# Patient Record
Sex: Female | Born: 1945 | Race: White | Hispanic: No | Smoking: Never smoker
Health system: Southern US, Community
[De-identification: ages and names within clinical notes are randomized; demographics above are authoritative.]

## PROBLEM LIST (undated history)

## (undated) DIAGNOSIS — N189 Chronic kidney disease, unspecified: Secondary | ICD-10-CM

## (undated) DIAGNOSIS — K219 Gastro-esophageal reflux disease without esophagitis: Secondary | ICD-10-CM

## (undated) DIAGNOSIS — R55 Syncope and collapse: Secondary | ICD-10-CM

## (undated) DIAGNOSIS — H269 Unspecified cataract: Secondary | ICD-10-CM

## (undated) DIAGNOSIS — G709 Myoneural disorder, unspecified: Secondary | ICD-10-CM

## (undated) DIAGNOSIS — M1990 Unspecified osteoarthritis, unspecified site: Secondary | ICD-10-CM

## (undated) DIAGNOSIS — T7840XA Allergy, unspecified, initial encounter: Secondary | ICD-10-CM

## (undated) HISTORY — PX: BREAST REDUCTION SURGERY: SHX8

## (undated) HISTORY — DX: Chronic kidney disease, unspecified: N18.9

## (undated) HISTORY — DX: Gastro-esophageal reflux disease without esophagitis: K21.9

## (undated) HISTORY — DX: Unspecified cataract: H26.9

## (undated) HISTORY — PX: SCALP LACERATION REPAIR: SHX6089

## (undated) HISTORY — DX: Unspecified osteoarthritis, unspecified site: M19.90

## (undated) HISTORY — DX: Allergy, unspecified, initial encounter: T78.40XA

## (undated) HISTORY — DX: Myoneural disorder, unspecified: G70.9

---

## 1997-05-31 ENCOUNTER — Ambulatory Visit (HOSPITAL_COMMUNITY): Admission: RE | Admit: 1997-05-31 | Discharge: 1997-05-31 | Payer: Self-pay | Admitting: *Deleted

## 1997-11-29 ENCOUNTER — Ambulatory Visit (HOSPITAL_COMMUNITY): Admission: RE | Admit: 1997-11-29 | Discharge: 1997-11-29 | Payer: Self-pay | Admitting: *Deleted

## 1998-04-09 ENCOUNTER — Other Ambulatory Visit: Admission: RE | Admit: 1998-04-09 | Discharge: 1998-04-09 | Payer: Self-pay | Admitting: *Deleted

## 1998-04-24 ENCOUNTER — Ambulatory Visit (HOSPITAL_COMMUNITY): Admission: RE | Admit: 1998-04-24 | Discharge: 1998-04-24 | Payer: Self-pay | Admitting: *Deleted

## 1998-06-08 ENCOUNTER — Ambulatory Visit (HOSPITAL_COMMUNITY): Admission: RE | Admit: 1998-06-08 | Discharge: 1998-06-08 | Payer: Self-pay | Admitting: *Deleted

## 1999-06-11 ENCOUNTER — Ambulatory Visit (HOSPITAL_COMMUNITY): Admission: RE | Admit: 1999-06-11 | Discharge: 1999-06-11 | Payer: Self-pay | Admitting: *Deleted

## 2001-12-02 ENCOUNTER — Other Ambulatory Visit: Admission: RE | Admit: 2001-12-02 | Discharge: 2001-12-02 | Payer: Self-pay | Admitting: Internal Medicine

## 2001-12-10 ENCOUNTER — Encounter: Payer: Self-pay | Admitting: Internal Medicine

## 2001-12-10 ENCOUNTER — Ambulatory Visit (HOSPITAL_COMMUNITY): Admission: RE | Admit: 2001-12-10 | Discharge: 2001-12-10 | Payer: Self-pay | Admitting: Internal Medicine

## 2003-01-05 ENCOUNTER — Ambulatory Visit (HOSPITAL_COMMUNITY): Admission: RE | Admit: 2003-01-05 | Discharge: 2003-01-05 | Payer: Self-pay | Admitting: Internal Medicine

## 2003-01-05 ENCOUNTER — Encounter: Payer: Self-pay | Admitting: Internal Medicine

## 2003-01-16 ENCOUNTER — Ambulatory Visit (HOSPITAL_COMMUNITY): Admission: RE | Admit: 2003-01-16 | Discharge: 2003-01-16 | Payer: Self-pay | Admitting: Gastroenterology

## 2009-07-02 ENCOUNTER — Inpatient Hospital Stay (HOSPITAL_COMMUNITY): Admission: EM | Admit: 2009-07-02 | Discharge: 2009-07-06 | Payer: Self-pay | Admitting: Emergency Medicine

## 2009-07-04 ENCOUNTER — Ambulatory Visit: Payer: Self-pay | Admitting: Internal Medicine

## 2009-07-12 ENCOUNTER — Emergency Department (HOSPITAL_COMMUNITY): Admission: EM | Admit: 2009-07-12 | Discharge: 2009-07-12 | Payer: Self-pay | Admitting: Emergency Medicine

## 2009-07-17 ENCOUNTER — Encounter: Admission: RE | Admit: 2009-07-17 | Discharge: 2009-07-17 | Payer: Self-pay | Admitting: Neurological Surgery

## 2009-07-23 ENCOUNTER — Encounter: Payer: Self-pay | Admitting: Internal Medicine

## 2009-07-27 ENCOUNTER — Ambulatory Visit (HOSPITAL_COMMUNITY): Admission: RE | Admit: 2009-07-27 | Discharge: 2009-07-29 | Payer: Self-pay | Admitting: Neurological Surgery

## 2009-08-02 ENCOUNTER — Ambulatory Visit: Payer: Self-pay | Admitting: Internal Medicine

## 2009-08-02 DIAGNOSIS — R9409 Abnormal results of other function studies of central nervous system: Secondary | ICD-10-CM

## 2009-08-02 DIAGNOSIS — E785 Hyperlipidemia, unspecified: Secondary | ICD-10-CM

## 2009-08-02 DIAGNOSIS — S0990XA Unspecified injury of head, initial encounter: Secondary | ICD-10-CM | POA: Insufficient documentation

## 2009-08-02 DIAGNOSIS — I609 Nontraumatic subarachnoid hemorrhage, unspecified: Secondary | ICD-10-CM

## 2009-08-02 DIAGNOSIS — I10 Essential (primary) hypertension: Secondary | ICD-10-CM | POA: Insufficient documentation

## 2009-08-02 DIAGNOSIS — H8110 Benign paroxysmal vertigo, unspecified ear: Secondary | ICD-10-CM

## 2009-08-02 DIAGNOSIS — R413 Other amnesia: Secondary | ICD-10-CM

## 2009-08-02 DIAGNOSIS — L089 Local infection of the skin and subcutaneous tissue, unspecified: Secondary | ICD-10-CM | POA: Insufficient documentation

## 2009-08-02 DIAGNOSIS — S0100XA Unspecified open wound of scalp, initial encounter: Secondary | ICD-10-CM | POA: Insufficient documentation

## 2009-08-07 ENCOUNTER — Encounter: Payer: Self-pay | Admitting: Internal Medicine

## 2009-08-09 ENCOUNTER — Encounter: Admission: RE | Admit: 2009-08-09 | Discharge: 2009-08-09 | Payer: Self-pay | Admitting: Neurological Surgery

## 2009-08-20 ENCOUNTER — Ambulatory Visit: Payer: Self-pay | Admitting: Internal Medicine

## 2009-08-23 ENCOUNTER — Encounter: Payer: Self-pay | Admitting: Internal Medicine

## 2009-08-27 ENCOUNTER — Encounter: Admission: RE | Admit: 2009-08-27 | Discharge: 2009-09-26 | Payer: Self-pay | Admitting: Neurology

## 2010-05-19 ENCOUNTER — Encounter: Payer: Self-pay | Admitting: Neurological Surgery

## 2010-05-28 NOTE — Assessment & Plan Note (Signed)
Summary: 2wk f/u/vs   CC:  follow-up visit.  History of Present Illness: Candace Robbins is in for her routine visit. She is currently in her 18th day of IV Ancef therapy and completing a 26 days of total antibiotics for her methicillin sensitive staph aureus scalp wound infection.  She says that problem is doing much better.  She had a recent brain MRI scan on April 14 which showed that the "T2 signal abnormality had significantly regressed". She is due to start vestibular therapy soon for her chronic dizziness/vertigo.  She and her daughter are concerned about what caused her fall and have the closed head injury last month.  They are also concerned about her more chronic deterioration of her handwriting in memory.  Preventive Screening-Counseling & Management  Alcohol-Tobacco     Alcohol drinks/day: 0     Smoking Status: never  Caffeine-Diet-Exercise     Caffeine use/day: yes     Does Patient Exercise: no  Safety-Violence-Falls     Seat Belt Use: yes  Vital Signs:  Patient profile:   65 year old female Menstrual status:  postmenopausal Height:      68 inches (172.72 cm) Weight:      205.5 pounds (93.41 kg) BMI:     31.36 Temp:     98.0 degrees F (36.67 degrees C) oral Pulse rate:   82 / minute BP sitting:   139 / 86  (right arm) Cuff size:   large  Vitals Entered By: Jennet Maduro RN (August 20, 2009 3:32 PM) CC: follow-up visit Is Patient Diabetic? No Pain Assessment Patient in pain? no      Nutritional Status BMI of > 30 = obese Nutritional Status Detail appetite :very good"  Have you ever been in a relationship where you felt threatened, hurt or afraid?not asked, neice present   Does patient need assistance? Functional Status Cook/clean, Shopping, Social activities Ambulation Normal   Physical Exam  General:  alert and overweight-appearing.   Head:  her left posterior scalp incision is healing nicely.  There is no further drainage or open lesions.  I see no  evidence of active infection. Eyes:  vision grossly intact, pupils equal, and pupils round.   Neurologic:  alert & oriented X3, cranial nerves II-XII intact, strength normal in all extremities, and gait normal.  her speech is normal.   Impression & Recommendations:  Problem # 1:  WOUND INFECTION (ICD-686.9) Her scalp wound infection has healed. I will stop her Ancef today and have the PICC pulled. Orders: Est. Patient Level IV (16109)  Problem # 2:  BENIGN PAROXYSMAL POSITIONAL VERTIGO (ICD-386.11) This problem sounds to be chronic and unchanged in frequency but quite bothersome for her.  I doubt that it was the cause of her fall leading to a closed head injury recently.  While hospitalized there is no evidence of seizure activity, cardiac events, orthostatic hypotension, CNS infection or stroke causing her fall.  I told her and her daughter that this will need to be monitored going forward.  I am hopeful that the vestibular therapy will help. Orders: Est. Patient Level IV (60454)  Problem # 3:  MRI, BRAIN, ABNORMAL (ICD-794.09) It appears that the abnormalities on her MRI were related to her closed head injury and are improving spontaneously with time.  I see no evidence of CNS infection and do not feel she needs any further evaluation for this.  At present there is no clear explanation for her intermittent memory problems or history of deteriorating  handwriting.  I suggested that she have Dr. Leonides Sake, her primary care physician, and Dr. Kelli Hope, her neurologist, monitor her these problems and help determine what might need to be done for them. Orders: Est. Patient Level IV (47829)  Medications Added to Medication List This Visit: 1)  Lisinopril-hydrochlorothiazide 20-25 Mg Tabs (Lisinopril-hydrochlorothiazide) .... Take 1 tablet by mouth once a day 2)  Indomethacin 25 Mg Caps (Indomethacin) .... Take 1 tablet by mouth three times a day as needed for gout  Patient  Instructions: 1)  Please schedule a follow-up appointment as needed.

## 2010-05-28 NOTE — Assessment & Plan Note (Signed)
Summary: HSFU/KAM   CC:  [facility].  History of Present Illness: Ms. Candace Robbins is in for her hospital follow-up visit.  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  She also had a left posterior scalp laceration.  An MRI scan of her brain showed diffuse white matter changes which were not felt to be related to the trauma and I was consulted.  She had no evidence of infection in and we decided to observe her and not pursue a lumbar puncture at that time.  She and her niece were able to describe that she had been having some deterioration in her hand writing over the last 6 to 12 months and also intermittent periods of forgetfulness.  All of this predated the fall and subarachnoid hemorrhages.  Since discharge from the hospital her niece has said that she has been having her right almost daily and says that she has noted further deterioration in her handwriting.  She is also noted that she would sometimes used incorrect words or invert words which she says is very much unlike her particularly since she is a typesetter and normally would have no difficulties with this.  Also, she recently did not know who her grandniece is best friend was when she came to visit only recalling a day later that her name was Angie.   She was rehospitalized briefly recently when her scalp incision became infected.  She underwent incision and drainage and operative cultures grew methicillin sensitive staph aureus.  She had a PICC placed and has been receiving vancomycin at home.  Preventive Screening-Counseling & Management  Alcohol-Tobacco     Alcohol drinks/day: 0     Smoking Status: never  Caffeine-Diet-Exercise     Caffeine use/day: yes     Does Patient Exercise: no  Safety-Violence-Falls     Seat Belt Use: yes   Updated Prior Medication List: VANCOMYCIN HCL 1000 MG SOLR (VANCOMYCIN HCL) iv   Past History:  Past Medical  History: Hyperlipidemia Hypertension  Additional History Menstrual Status:  postmenopausal  Vital Signs:  Patient profile:   65 year old female Menstrual status:  postmenopausal Height:      68 inches (172.72 cm) Weight:      206.5 pounds (93.86 kg) BMI:     31.51 Pulse rate:   70 / minute BP sitting:   185 / 108  (right arm) Cuff size:   regular  Vitals Entered By: Jennet Maduro RN (August 02, 2009 3:29 PM) CC: HSFU Pain Assessment Patient in pain? no      Nutritional Status BMI of > 30 = obese Nutritional Status Detail appetite "good"  Have you ever been in a relationship where you felt threatened, hurt or afraid?not asked someone else present   Does patient need assistance? Functional Status Self care Ambulation Impaired:Risk for fall Comments uses four-prong cane     Menstrual Status postmenopausal   Physical Exam  General:  alert and overweight-appearing.   Head:  her left posterior scalp incision is stapled and intact.  There is swelling of the wound but her niece says this has improved.  There is no drainage at this time. Eyes:  vision grossly intact, pupils equal, and pupils round.   Lungs:  normal breath sounds.   Heart:  normal rate, regular rhythm, and no murmur.   Neurologic:  alert & oriented X3, cranial nerves II-XII intact, strength normal in all extremities, and gait  normal.  her speech is normal. Psych:  normally interactive, good eye contact, not anxious appearing, and not depressed appearing.     Impression & Recommendations:  Problem # 1:  WOUND INFECTION (ICD-686.9) I will change her to IV Ancef which is probably a little more effective for a methicillin sensitive staph aureus the vancomycin.  Problem # 2:  MRI, BRAIN, ABNORMAL (ICD-794.09) I do not know the cause of her white matter lesions on her MRI scan but I do suspect that she does have some chronic slowly progressive neurologic illness that is causing her memory loss and her  deterioration in her handwriting.  I am not convinced that this is infectious in nature.  She is scheduled for a follow-up MRI in one week once the staples have been removed from her incision. Orders: Est. Patient Level IV (04540)  Medications Added to Medication List This Visit: 1)  Cefazolin Sodium 1 Gm Solr (Cefazolin sodium) .... Via pic every 8 hours  Patient Instructions: 1)  Please schedule a follow-up appointment in 2 weeks.

## 2010-05-28 NOTE — Miscellaneous (Signed)
Summary: HIPAA Restrictions  HIPAA Restrictions   Imported By: Florinda Marker 08/03/2009 15:07:50  _____________________________________________________________________  External Attachment:    Type:   Image     Comment:   External Document

## 2010-05-28 NOTE — Letter (Signed)
Summary: Health Care Power Of [facility] Power Of Attorney   Imported By: Florinda Marker 08/08/2009 09:42:52  _____________________________________________________________________  External Attachment:    Type:   Image     Comment:   External Document

## 2010-05-28 NOTE — Consult Note (Signed)
Summary: [facility]   Imported By: Florinda Marker 08/09/2009 15:41:37  _____________________________________________________________________  External Attachment:    Type:   Image     Comment:   External Document

## 2010-05-31 NOTE — Miscellaneous (Signed)
Summary: [facility]: Orders  [facility]: Orders   Imported By: Florinda Marker 09/13/2009 11:39:50  _____________________________________________________________________  External Attachment:    Type:   Image     Comment:   External Document

## 2010-07-17 LAB — GRAM STAIN

## 2010-07-17 LAB — ANAEROBIC CULTURE

## 2010-07-17 LAB — WOUND CULTURE

## 2010-07-17 LAB — VANCOMYCIN, TROUGH: Vancomycin Tr: 19.4 ug/mL (ref 10.0–20.0)

## 2010-07-22 LAB — APTT: aPTT: 26 seconds (ref 24–37)

## 2010-07-22 LAB — BASIC METABOLIC PANEL
BUN: 19 mg/dL (ref 6–23)
BUN: 22 mg/dL (ref 6–23)
BUN: 29 mg/dL — ABNORMAL HIGH (ref 6–23)
Calcium: 8.5 mg/dL (ref 8.4–10.5)
Calcium: 9.1 mg/dL (ref 8.4–10.5)
Calcium: 9.7 mg/dL (ref 8.4–10.5)
Chloride: 104 mEq/L (ref 96–112)
Chloride: 109 mEq/L (ref 96–112)
Chloride: 109 mEq/L (ref 96–112)
Chloride: 111 mEq/L (ref 96–112)
Creatinine, Ser: 0.79 mg/dL (ref 0.4–1.2)
Creatinine, Ser: 1.06 mg/dL (ref 0.4–1.2)
GFR calc Af Amer: >60 mL/min (ref >60)
GFR calc non Af Amer: 52 mL/min — ABNORMAL LOW (ref >60)
GFR calc non Af Amer: 56 mL/min — ABNORMAL LOW (ref >60)
GFR calc non Af Amer: >60 mL/min (ref >60)
GFR calc non Af Amer: >60 mL/min (ref >60)
Glucose, Bld: 138 mg/dL — ABNORMAL HIGH (ref 70–99)
Glucose, Bld: 145 mg/dL — ABNORMAL HIGH (ref 70–99)
Potassium: 3.5 mEq/L (ref 3.5–5.1)
Potassium: 4.1 mEq/L (ref 3.5–5.1)
Potassium: 4.4 mEq/L (ref 3.5–5.1)
Potassium: 4.4 mEq/L (ref 3.5–5.1)
Potassium: 4.4 mEq/L (ref 3.5–5.1)
Sodium: 137 mEq/L (ref 135–145)
Sodium: 140 mEq/L (ref 135–145)
Sodium: 142 mEq/L (ref 135–145)

## 2010-07-22 LAB — CBC
HCT: 36.4 % (ref 36.0–46.0)
HCT: 36.7 % (ref 36.0–46.0)
HCT: 37.7 % (ref 36.0–46.0)
HCT: 38.9 % (ref 36.0–46.0)
HCT: 43.4 % (ref 36.0–46.0)
Hemoglobin: 12.7 g/dL (ref 12.0–15.0)
Hemoglobin: 12.7 g/dL (ref 12.0–15.0)
Hemoglobin: 13.5 g/dL (ref 12.0–15.0)
Hemoglobin: 15.1 g/dL — ABNORMAL HIGH (ref 12.0–15.0)
MCV: 93.9 fL (ref 78.0–100.0)
MCV: 93.9 fL (ref 78.0–100.0)
MCV: 94.2 fL (ref 78.0–100.0)
MCV: 94.8 fL (ref 78.0–100.0)
Platelets: 127 10*3/uL — ABNORMAL LOW (ref 150–400)
Platelets: 140 10*3/uL — ABNORMAL LOW (ref 150–400)
Platelets: 172 10*3/uL (ref 150–400)
RBC: 3.98 MIL/uL (ref 3.87–5.11)
RBC: 4.14 MIL/uL (ref 3.87–5.11)
RBC: 4.62 MIL/uL (ref 3.87–5.11)
RDW: 12.9 % (ref 11.5–15.5)
RDW: 13.3 % (ref 11.5–15.5)
RDW: 13.4 % (ref 11.5–15.5)
WBC: 16.8 10*3/uL — ABNORMAL HIGH (ref 4.0–10.5)
WBC: 6.4 10*3/uL (ref 4.0–10.5)
WBC: 7.3 10*3/uL (ref 4.0–10.5)

## 2010-07-22 LAB — DIFFERENTIAL
Eosinophils Absolute: 0.1 10*3/uL (ref 0.0–0.7)
Eosinophils Relative: 2 % (ref 0–5)
Lymphocytes Relative: 26 % (ref 12–46)
Lymphocytes Relative: 31 % (ref 12–46)
Lymphs Abs: 2 10*3/uL (ref 0.7–4.0)
Neutro Abs: 4.8 10*3/uL (ref 1.7–7.7)
Neutrophils Relative %: 58 % (ref 43–77)
Neutrophils Relative %: 66 % (ref 43–77)

## 2010-07-22 LAB — ANA: Anti Nuclear Antibody(ANA): NEGATIVE

## 2010-07-22 LAB — URINALYSIS, ROUTINE W REFLEX MICROSCOPIC
Leukocytes, UA: NEGATIVE
Protein, ur: 100 mg/dL — AB
pH: 8 (ref 5.0–8.0)

## 2010-07-22 LAB — URINE MICROSCOPIC-ADD ON

## 2010-07-22 LAB — RPR: RPR Ser Ql: NONREACTIVE

## 2010-07-22 LAB — SURGICAL PCR SCREEN
MRSA, PCR: NEGATIVE
Staphylococcus aureus: POSITIVE — AB

## 2010-07-22 LAB — PROTIME-INR
INR: 0.99 (ref 0.00–1.49)
Prothrombin Time: 13 seconds (ref 11.6–15.2)

## 2010-07-22 LAB — C-REACTIVE PROTEIN: CRP: 1.2 mg/dL — ABNORMAL HIGH (ref <0.6)

## 2010-07-22 LAB — SEDIMENTATION RATE: Sed Rate: 21 mm/hr (ref 0–22)

## 2010-08-06 ENCOUNTER — Encounter: Payer: Self-pay | Admitting: *Deleted

## 2010-09-13 NOTE — Op Note (Signed)
   NAME:  Candace Robbins, Candace Robbins                         ACCOUNT NO.:  [id number]   MEDICAL RECORD NO.:  [id number]                   PATIENT TYPE:  AMB   LOCATION:  ENDO                                 FACILITY:  [facility]   PHYSICIAN:  John C. Madilyn Fireman, M.D.                 DATE OF BIRTH:  1946/03/04   DATE OF PROCEDURE:  01/16/2003  DATE OF DISCHARGE:                                 OPERATIVE REPORT   PROCEDURE:  Colonoscopy.   INDICATIONS FOR PROCEDURE:  Family history of colon cancer in two first  degree relative.   DESCRIPTION OF PROCEDURE:  The patient was placed in the left lateral  decubitus position then placed on the pulse monitor with continuous low flow  oxygen delivered by nasal cannula. She was sedated with 100 mcg IV fentanyl  and 10 mg IV Versed. The Olympus video colonoscope was inserted into the  rectum and advanced to the cecum, confirmed by transillumination at  McBurney's point and visualization at the ileocecal valve and appendiceal  orifice. The prep was excellent. The cecum, ascending, transverse,  descending and sigmoid colon all appeared normal with no masses, polyps,  diverticula or other mucosal abnormalities. The rectum likewise appeared  normal and retroflexed view of the anus revealed no obvious internal  hemorrhoids. The colonoscope was then withdrawn and the patient returned to  the recovery room in stable condition. The patient tolerated the procedure  well and there were no immediate complications.   IMPRESSION:  Normal study.   PLAN:  Repeat colonoscopy in five years.                                               John C. Madilyn Fireman, M.D.    [facility]/MEDQ  D:  01/16/2003  T:  01/16/2003  Job:  [id number]   cc:   Lovenia Kim, D.O.  [address], [address]  [place]  [place] [address]  Fax: [phone]

## 2010-11-05 IMAGING — CT CT HEAD W/O CM
2 series · 16 of 30 positions shown, 18 images · non-contrast
Comparison: 07/02/2009

CLINICAL DATA: Follow-up subarachnoid hemorrhage

CT HEAD WITHOUT CONTRAST
TECHNIQUE: Contiguous axial images were obtained from the base of
the skull through the vertex without contrast.

[Series 3: head bone · axial · 0.49mm/px · z∈[+21,+151]mm · 8 of 64 slices shown]
[im 7/64  bone]
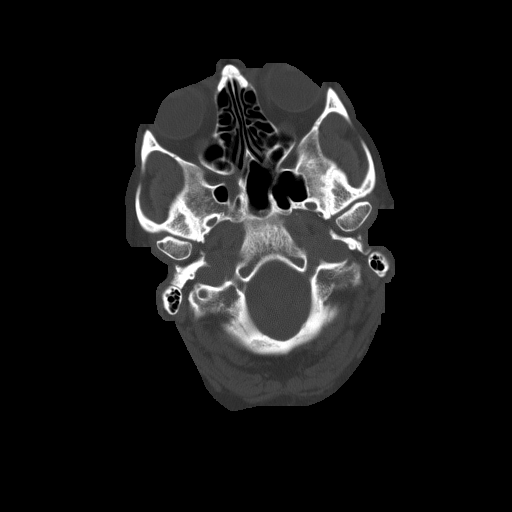
[im 14/64  bone]
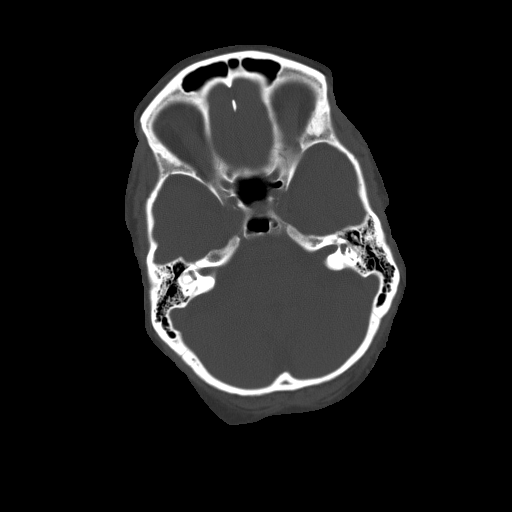
[im 20/64  bone]
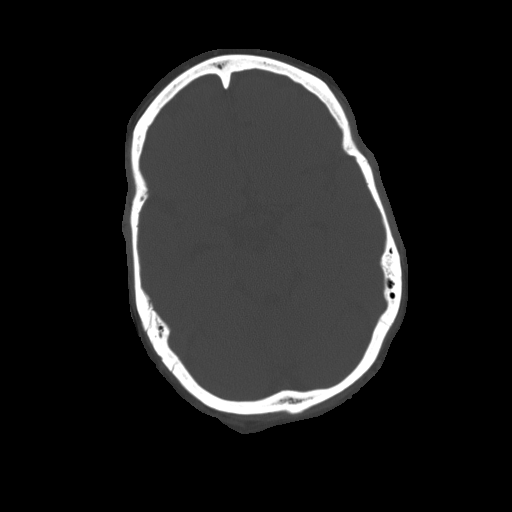
[im 27/64  bone]
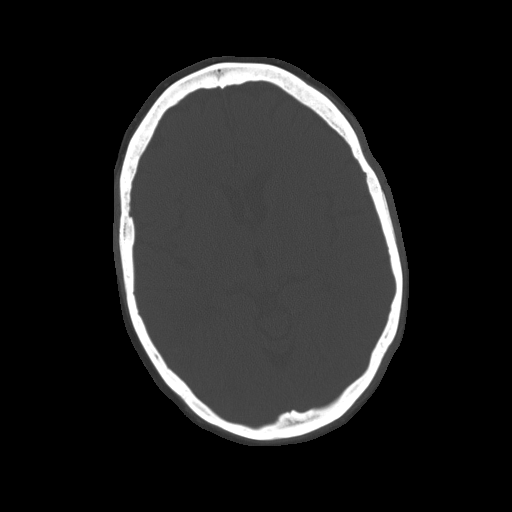
[im 37/64  bone]
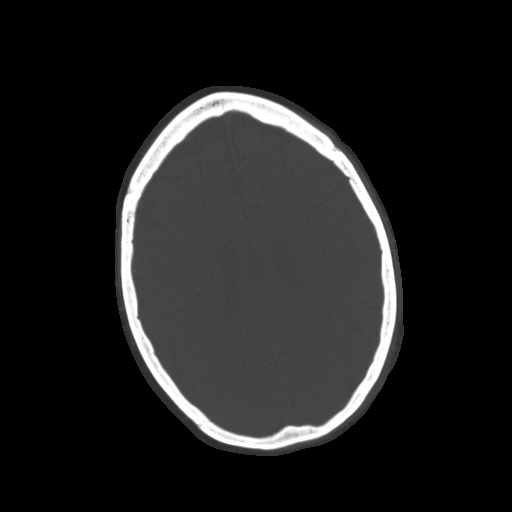
[im 44/64  bone]
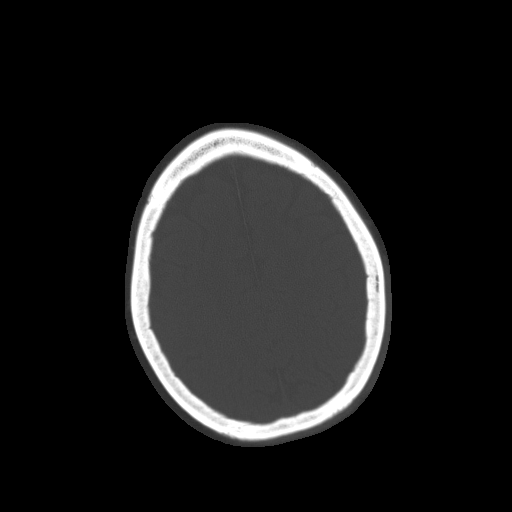
[im 50/64  bone]
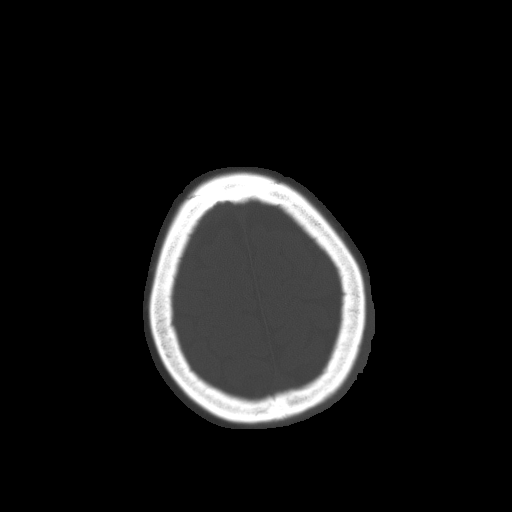
[im 57/64  bone]
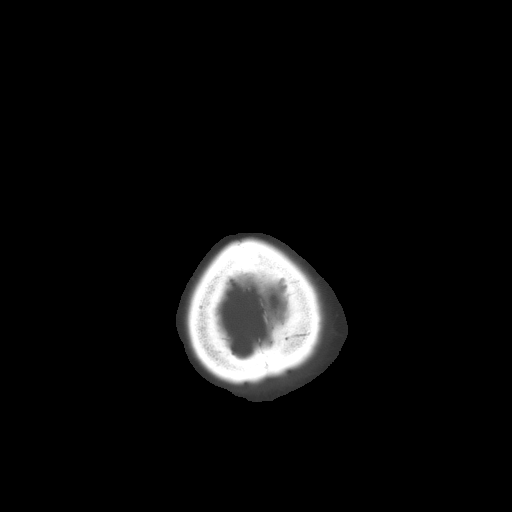

[Series 32: 3d filtered head w/o · axial · non-contrast · 0.49mm/px · z∈[+23,+147]mm · 8 of 32 slices shown, 10 images]
[im 4/32  brain]
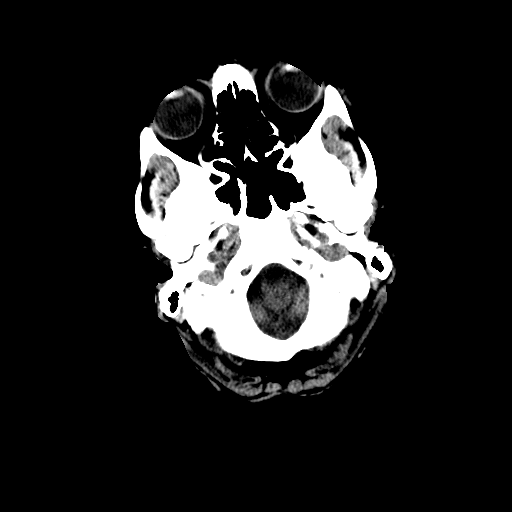
[im 4/32  bone]
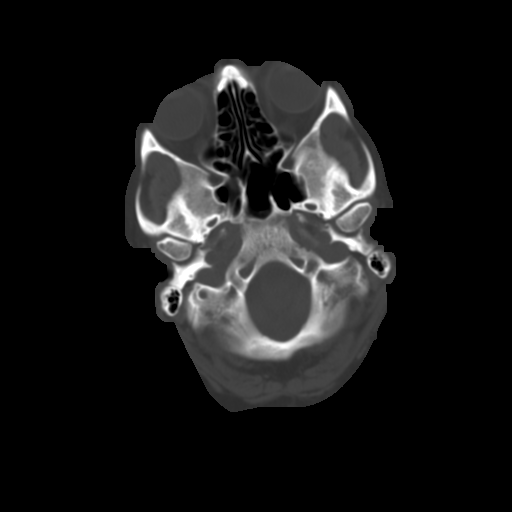
[im 7/32  brain]
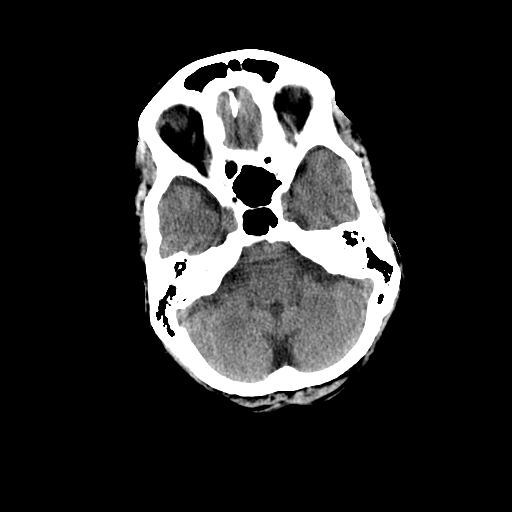
[im 11/32  brain]
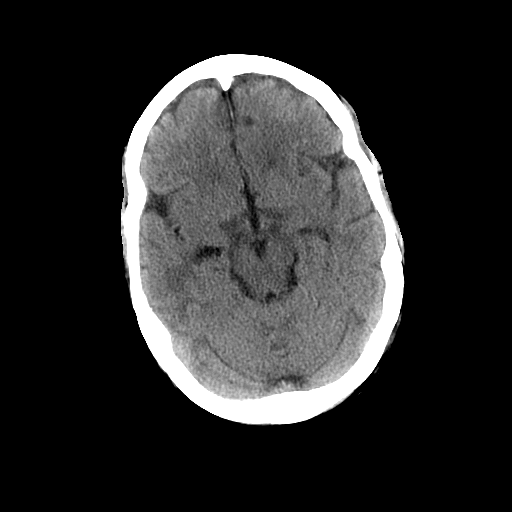
[im 14/32  brain]
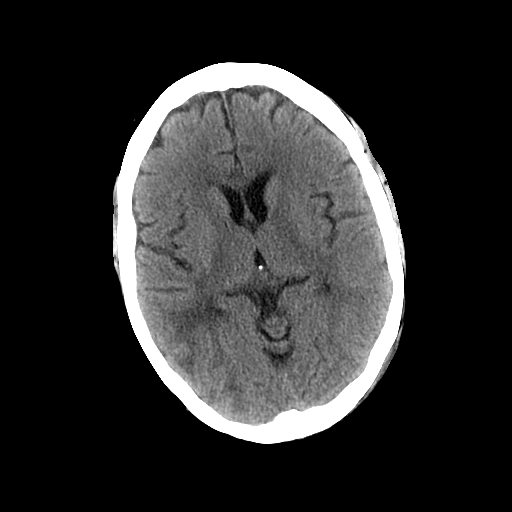
[im 18/32  brain]
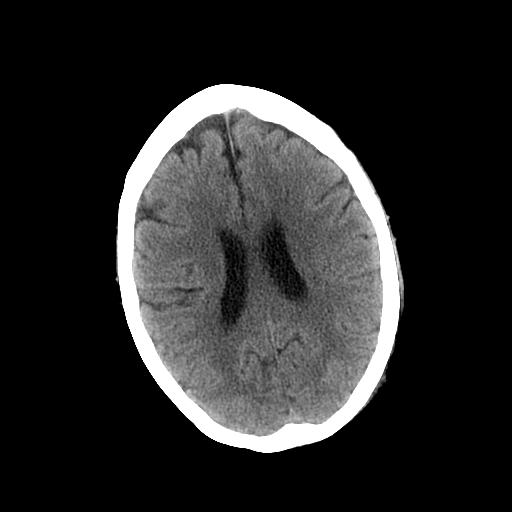
[im 18/32  bone]
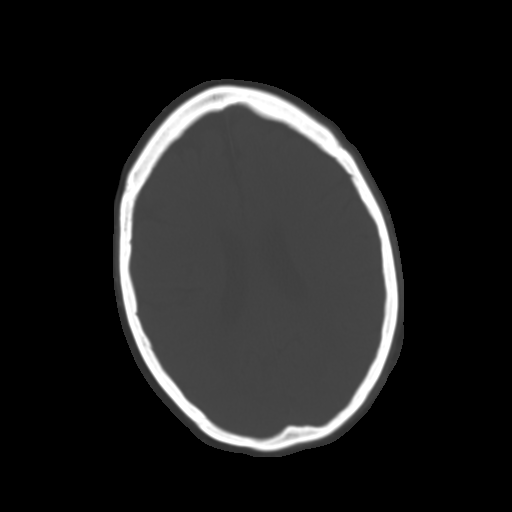
[im 21/32  brain]
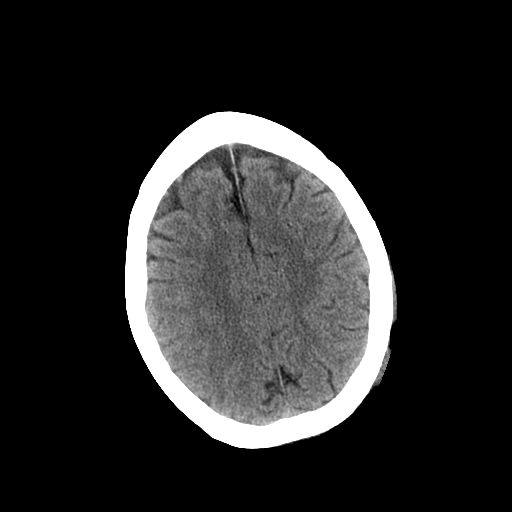
[im 25/32  brain]
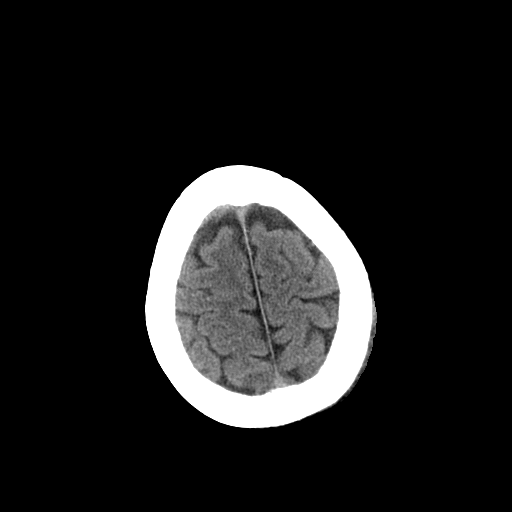
[im 28/32  brain]
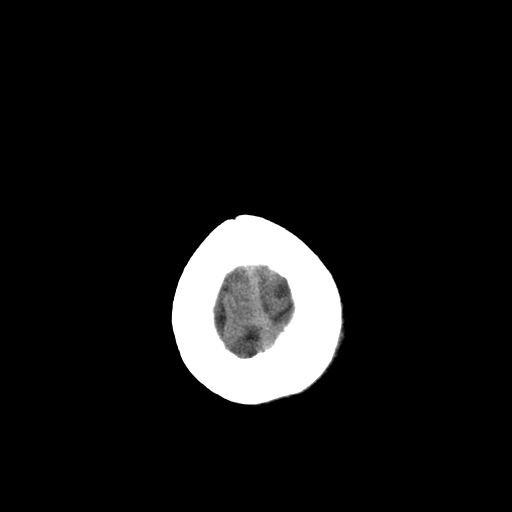

[16 of 30 positions shown; findings below may reference images not displayed]

FINDINGS: There is no residual hyperdense blood within the
intracranial region.  Low density in the white matter of the right
temporo-parietal region and to a lesser extent in both occipital
regions remains evident.  No sign of acute infarction, mass lesion,
acute hemorrhage, hydrocephalus or extra-axial collection.  Scalp
injury on the left remains evident.  No underlying skull fracture.
Sinuses, middle ears and mastoids are clear.
IMPRESSION: Resolution of intracranial hemorrhage. Persistent low density in
the white matter of the right temporo-parietal region.  Lesser low
density in the occipital white matter.  Etiology of this remains
uncertain.  Findings could be old and inactive or could possibly
relate to an active process including gliomatosis, post-traumatic
change, sequela of autoregulatory disorder and chronic ischemic
change.  Follow-up MRI for comparison with the 07/03/2009 study
would be useful.

## 2011-05-05 ENCOUNTER — Ambulatory Visit (INDEPENDENT_AMBULATORY_CARE_PROVIDER_SITE_OTHER): Payer: Medicare Other

## 2011-05-05 DIAGNOSIS — M1710 Unilateral primary osteoarthritis, unspecified knee: Secondary | ICD-10-CM

## 2013-02-20 ENCOUNTER — Ambulatory Visit (INDEPENDENT_AMBULATORY_CARE_PROVIDER_SITE_OTHER): Payer: Medicare Other | Admitting: Emergency Medicine

## 2013-02-20 VITALS — BP 100/60 | HR 74 | Temp 98.1°F | Resp 16 | Ht 67.0 in | Wt 192.0 lb

## 2013-02-20 DIAGNOSIS — L0291 Cutaneous abscess, unspecified: Secondary | ICD-10-CM

## 2013-02-20 DIAGNOSIS — L0390 Cellulitis, unspecified: Secondary | ICD-10-CM

## 2013-02-20 MED ORDER — CIPROFLOXACIN HCL 500 MG PO TABS: 500.0000 mg | ORAL_TABLET | Freq: Two times a day (BID) | ORAL | Status: DC

## 2013-02-20 MED ORDER — DOXYCYCLINE HYCLATE 100 MG PO CAPS: 100.0000 mg | ORAL_CAPSULE | Freq: Two times a day (BID) | ORAL | Status: DC

## 2013-02-20 NOTE — Progress Notes (Signed)
[facility] [address] [place] [address] [phone]  Date:  02/20/2013   Name:  Candace Robbins   DOB:  03/13/1946   MRN:  [id number]  PCP:  No primary provider on file.    Chief Complaint: Toe Pain   History of Present Illness:  Candace Robbins is a 68 y.o. very pleasant female patient who presents with the following:  Developed pain in her right second toe about a week ago.  No history of injury.  Has history of gout.  No fever or chills.  No nausea or vomiting.  No rash.  No improvement with over the counter medications or other home remedies. Denies other complaint or health concern today.   Patient Active Problem List   Diagnosis Date Noted  . HYPERLIPIDEMIA 08/02/2009  . BENIGN PAROXYSMAL POSITIONAL VERTIGO 08/02/2009  . HYPERTENSION 08/02/2009  . SUBARACHNOID HEMORRHAGE 08/02/2009  . WOUND INFECTION 08/02/2009  . MEMORY LOSS 08/02/2009  . MRI, BRAIN, ABNORMAL 08/02/2009  . LACERATION, SCALP 08/02/2009  . HEAD TRAUMA, CLOSED 08/02/2009    Past Medical History  Diagnosis Date  . Allergy   . Arthritis   . GERD (gastroesophageal reflux disease)   . Chronic kidney disease   . Neuromuscular disorder   . Cataract     Past Surgical History  Procedure Laterality Date  . Breast surgery      History  Substance Use Topics  . Smoking status: Never Smoker   . Smokeless tobacco: Not on file  . Alcohol Use: Not on file    Family History  Problem Relation Age of Onset  . Cancer Mother     No Known Allergies  Medication list has been reviewed and updated.  Current Outpatient Prescriptions on File Prior to Visit  Medication Sig Dispense Refill  . lisinopril-hydrochlorothiazide (PRINZIDE,ZESTORETIC) 20-25 MG per tablet Take 1 tablet by mouth daily.        . indomethacin (INDOCIN) 25 MG capsule Take 25 mg by mouth 3 (three) times daily with meals.         No current facility-administered medications on file prior to visit.     Review of Systems:  As per HPI, otherwise negative.    Physical Examination: Filed Vitals:   02/20/13 1402  BP: 100/60  Pulse: 74  Temp: 98.1 F (36.7 C)  Resp: 16   Filed Vitals:   02/20/13 1402  Height: 5\' 7"  (1.702 m)  Weight: 192 lb (87.091 kg)   Body mass index is 30.06 kg/(m^2). Ideal Body Weight: Weight in (lb) to have BMI = 25: 159.3   GEN: WDWN, NAD, Non-toxic, Alert & Oriented x 3 HEENT: Atraumatic, Normocephalic.  Ears and Nose: No external deformity. EXTR: No clubbing/cyanosis/edema NEURO: Normal gait.  PSYCH: Normally interactive. Conversant. Not depressed or anxious appearing.  Calm demeanor.  RIGHT foot:  Second toe erythematous, warm and swollen.  Very tender laterally at DIP.  No ecchymosis or deformity.    Assessment and Plan: Cellulitis foot cipro Doxy Warm soaks Follow up as needed   Signed,  Phillips Odor, MD

## 2013-02-20 NOTE — Patient Instructions (Signed)
Cellulitis Cellulitis is an infection of the skin and the tissue beneath it. The infected area is usually red and tender. Cellulitis occurs most often in the arms and lower legs.  CAUSES  Cellulitis is caused by bacteria that enter the skin through cracks or cuts in the skin. The most common types of bacteria that cause cellulitis are Staphylococcus and Streptococcus. SYMPTOMS   Redness and warmth.  Swelling.  Tenderness or pain.  Fever. DIAGNOSIS  Your caregiver can usually determine what is wrong based on a physical exam. Blood tests may also be done. TREATMENT  Treatment usually involves taking an antibiotic medicine. HOME CARE INSTRUCTIONS   Take your antibiotics as directed. Finish them even if you start to feel better.  Keep the infected arm or leg elevated to reduce swelling.  Apply a warm cloth to the affected area up to 4 times per day to relieve pain.  Only take over-the-counter or prescription medicines for pain, discomfort, or fever as directed by your caregiver.  Keep all follow-up appointments as directed by your caregiver. SEEK MEDICAL CARE IF:   You notice red streaks coming from the infected area.  Your red area gets larger or turns dark in color.  Your bone or joint underneath the infected area becomes painful after the skin has healed.  Your infection returns in the same area or another area.  You notice a swollen bump in the infected area.  You develop new symptoms. SEEK IMMEDIATE MEDICAL CARE IF:   You have a fever.  You feel very sleepy.  You develop vomiting or diarrhea.  You have a general ill feeling (malaise) with muscle aches and pains. MAKE SURE YOU:   Understand these instructions.  Will watch your condition.  Will get help right away if you are not doing well or get worse. Document Released: 01/22/2005 Document Revised: 10/14/2011 Document Reviewed: 06/30/2011 Exit[employer] Patient Information 2014 [employer].  

## 2013-03-03 ENCOUNTER — Other Ambulatory Visit: Payer: Self-pay | Admitting: Family Medicine

## 2013-03-03 ENCOUNTER — Ambulatory Visit (INDEPENDENT_AMBULATORY_CARE_PROVIDER_SITE_OTHER): Payer: Medicare Other | Admitting: Family Medicine

## 2013-03-03 ENCOUNTER — Ambulatory Visit: Payer: Medicare Other

## 2013-03-03 VITALS — BP 142/62 | HR 110 | Temp 97.7°F | Resp 20 | Ht 67.0 in | Wt 193.0 lb

## 2013-03-03 DIAGNOSIS — M79674 Pain in right toe(s): Secondary | ICD-10-CM

## 2013-03-03 DIAGNOSIS — J3489 Other specified disorders of nose and nasal sinuses: Secondary | ICD-10-CM

## 2013-03-03 DIAGNOSIS — M7989 Other specified soft tissue disorders: Secondary | ICD-10-CM

## 2013-03-03 DIAGNOSIS — M79609 Pain in unspecified limb: Secondary | ICD-10-CM

## 2013-03-03 LAB — POCT CBC
Granulocyte percent: 61.9 %G (ref 37–80)
HCT, POC: 39.3 % (ref 37.7–47.9)
Hemoglobin: 12.5 g/dL (ref 12.2–16.2)
Lymph, poc: 2.7 (ref 0.6–3.4)
MCH, POC: 31.5 pg — AB (ref 27–31.2)
MCHC: 31.8 g/dL (ref 31.8–35.4)
MCV: 99 fL — AB (ref 80–97)
MID (cbc): 0.6 (ref 0–0.9)
MPV: 8.8 fL (ref 0–99.8)
POC Granulocyte: 5.3 (ref 2–6.9)
POC LYMPH PERCENT: 31.7 % (ref 10–50)
POC MID %: 6.4 % (ref 0–12)
Platelet Count, POC: 190 10*3/uL (ref 142–424)
RBC: 3.97 M/uL — AB (ref 4.04–5.48)
RDW, POC: 13.4 %
WBC: 8.6 10*3/uL (ref 4.6–10.2)

## 2013-03-03 NOTE — Progress Notes (Signed)
[facility]:  Office Visit  Chief Complaint:  Chief Complaint  Patient presents with  . Nail Problem    HPI: Candace Robbins is a 67 y.o. female who is here for right  2nd toe pain. She was in office 02/27/2013 for a bacterial infection in her right 2nd toe. She was given Doxycycline and Cipro for the toe infection x 10 days. She has run out of the antibiotics and her toe is not getting better. She has redness and swelling and pain. On the pain scale she states the pain is 5/10, before it was 9/10, she describes it as a dull aching pain, has pain with weight bearing. She has been soaking her foot in Epson [employer] every night.  She has not been elevating She states her pain is better yesterday but worse today She has been off of abx medicine x 1 day No sxs of fever.chills ( She states she does not have fevers generally), no known injury, no e.o diabetes  Left nasal lesion x 5-6 years, does bleed and ulcerates. She was referred to go to derm to see if it is cancerous but has not since not able to afford it, she reently got medicare several years ago.   Past Medical History  Diagnosis Date  . Allergy   . Arthritis   . GERD (gastroesophageal reflux disease)   . Chronic kidney disease   . Neuromuscular disorder   . Cataract    Past Surgical History  Procedure Laterality Date  . Breast surgery     History   Social History  . Marital Status: Married    Spouse Name: N/A    Number of Children: N/A  . Years of Education: N/A   Social History Main Topics  . Smoking status: Never Smoker   . Smokeless tobacco: None  . Alcohol Use: None  . Drug Use: None  . Sexual Activity: None   Other Topics Concern  . None   Social History Narrative  . None   Family History  Problem Relation Age of Onset  . Cancer Mother    No Known Allergies Prior to Admission medications   Medication Sig Start Date End Date Taking? Authorizing Provider  aspirin 81 MG tablet Take 81  mg by mouth daily.   Yes Historical Provider, MD  indomethacin (INDOCIN) 25 MG capsule Take 25 mg by mouth 3 (three) times daily with meals.     Yes Historical Provider, MD  lisinopril-hydrochlorothiazide (PRINZIDE,ZESTORETIC) 20-25 MG per tablet Take 1 tablet by mouth daily.     Yes Historical Provider, MD  simvastatin (ZOCOR) 20 MG tablet Take 20 mg by mouth every evening.   Yes Historical Provider, MD  traMADol (ULTRAM) 50 MG tablet Take 50 mg by mouth every 6 (six) hours as needed for pain.   Yes Historical Provider, MD  ciprofloxacin (CIPRO) 500 MG tablet Take 1 tablet (500 mg total) by mouth 2 (two) times daily. 02/20/13   Phillips Odor, MD  doxycycline (VIBRAMYCIN) 100 MG capsule Take 1 capsule (100 mg total) by mouth 2 (two) times daily. 02/20/13   Phillips Odor, MD     ROS: The patient denies fevers, chills, night sweats, unintentional weight loss, chest pain, palpitations, wheezing, dyspnea on exertion, nausea, vomiting, abdominal pain, dysuria, hematuria, melena, numbness, weakness, or tingling.   All other systems have been reviewed and were otherwise negative with the exception of those mentioned in the HPI and as above.    PHYSICAL EXAM:  Filed Vitals:   03/03/13 1914  BP: 142/62  Pulse: 110  Temp: 97.7 F (36.5 C)  Resp: 20   Filed Vitals:   03/03/13 1914  Height: 5\' 7"  (1.702 m)  Weight: 193 lb (87.544 kg)   Body mass index is 30.22 kg/(m^2).  General: Alert, no acute distress HEENT:  Normocephalic, atraumatic, oropharynx patent. EOMI, PERRLA Cardiovascular:  Regular rate and rhythm, no rubs murmurs or gallops.  No Carotid bruits, radial pulse intact. No pedal edema.  Respiratory: Clear to auscultation bilaterally.  No wheezes, rales, or rhonchi.  No cyanosis, no use of accessory musculature GI: No organomegaly, abdomen is soft and non-tender, positive bowel sounds.  No masses. Skin: No rashes. Neurologic: Facial musculature symmetric. Psychiatric: Patient is  appropriate throughout our interaction. Lymphatic: No cervical lymphadenopathy Musculoskeletal: Gait antalgic due to some pain. + 5/5 strength + 2 cap refill  + brisk DP, no e/o, no open wounds PAD Warm, tender at bottom of 2nd of total toe, all around erythema  And swelling    LABS: Results for orders placed in visit on 03/03/13  POCT CBC      Result Value Range   WBC 8.6  4.6 - 10.2 K/uL   Lymph, poc 2.7  0.6 - 3.4   POC LYMPH PERCENT 31.7  10 - 50 %L   MID (cbc) 0.6  0 - 0.9   POC MID % 6.4  0 - 12 %M   POC Granulocyte 5.3  2 - 6.9   Granulocyte percent 61.9  37 - 80 %G   RBC 3.97 (*) 4.04 - 5.48 M/uL   Hemoglobin 12.5  12.2 - 16.2 g/dL   HCT, POC 16.1  09.6 - 47.9 %   MCV 99.0 (*) 80 - 97 fL   MCH, POC 31.5 (*) 27 - 31.2 pg   MCHC 31.8  31.8 - 35.4 g/dL   RDW, POC 04.5     Platelet Count, POC 190  142 - 424 K/uL   MPV 8.8  0 - 99.8 fL     EKG/XRAY:   Primary read interpreted by Dr. Conley Rolls at [facility]. ? Periosteal changes on 2nd toe, please comment if this needs to be further evaluated for oesteomyelitis   ASSESSMENT/PLAN: Encounter Diagnoses  Name Primary?  . Toe pain, right Yes  . Toe swelling   . 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 Cipro is good coveral for osteomyelitis. I am suppicious this might be the case sicne she is not getting better.  She denies diabetes, has a h/o gout not on meds Pending labs ESR, CRP rule out osteomyelitis F/u with labs, call her tomorrow at 12 @ [phone] Will add on CMP if need further imaging studies.   Gross sideeffects, risk and benefits, and alternatives of medications d/w patient. Patient is aware that all medications have potential sideeffects and we are unable to predict every sideeffect or drug-drug interaction that may occur.  LE, THAO PHUONG, DO 03/03/2013 9:05 PM

## 2013-03-04 ENCOUNTER — Telehealth: Payer: Self-pay | Admitting: Family Medicine

## 2013-03-04 ENCOUNTER — Telehealth: Payer: Self-pay | Admitting: Radiology

## 2013-03-04 ENCOUNTER — Other Ambulatory Visit: Payer: Self-pay | Admitting: Family Medicine

## 2013-03-04 DIAGNOSIS — M79674 Pain in right toe(s): Secondary | ICD-10-CM

## 2013-03-04 LAB — BASIC METABOLIC PANEL WITH GFR
BUN: 37 mg/dL — ABNORMAL HIGH (ref 6–23)
Chloride: 102 meq/L (ref 96–112)
Creat: 1.61 mg/dL — ABNORMAL HIGH (ref 0.50–1.10)

## 2013-03-04 LAB — BASIC METABOLIC PANEL
CO2: 25 mEq/L (ref 19–32)
Calcium: 10.3 mg/dL (ref 8.4–10.5)
Glucose, Bld: 98 mg/dL (ref 70–99)
Potassium: 3.7 mEq/L (ref 3.5–5.3)
Sodium: 139 mEq/L (ref 135–145)

## 2013-03-04 LAB — HEMOGLOBIN A1C
Hgb A1c MFr Bld: 5.4 % (ref <5.7)
Mean Plasma Glucose: 108 mg/dL (ref <117)

## 2013-03-04 LAB — SEDIMENTATION RATE: Sed Rate: 36 mm/h — ABNORMAL HIGH (ref 0–22)

## 2013-03-04 LAB — URIC ACID: Uric Acid, Serum: 9.7 mg/dL — ABNORMAL HIGH (ref 2.4–7.0)

## 2013-03-04 LAB — C-REACTIVE PROTEIN: CRP: <0.5 mg/dL (ref <0.60)

## 2013-03-04 NOTE — Telephone Encounter (Signed)
Spoke to patient about xray results, will order MRI of foot with and without contrast but need to know her BMP first, ordered stat BMP, they will get it tom via phone and fax. Added HbA1c. Pt aware we are ruling out osteomyelitis.

## 2013-03-04 NOTE — Telephone Encounter (Signed)
I have gotten request for additional clinical information for [facility], regarding MRI lower ext.  please advise, what are you ruling out? And why do we need contrast? I think you are ruling out osteomyelitis, is this correct? Please advise. I can call  [employer] [phone] case [phone]

## 2013-03-07 ENCOUNTER — Telehealth: Payer: Self-pay | Admitting: Family Medicine

## 2013-03-07 DIAGNOSIS — M25579 Pain in unspecified ankle and joints of unspecified foot: Secondary | ICD-10-CM

## 2013-03-07 MED ORDER — METHYLPREDNISOLONE (PAK) 4 MG PO TABS: ORAL_TABLET | ORAL | Status: DC

## 2013-03-07 NOTE — Telephone Encounter (Signed)
Spoke to patient about labs, only thing elevated and abnormal was ESR and xrays suspect for osteomyelitis Will try a trial of prednisone and see if she improves since she ahs a h/o gout. She ahs kidney issues so will not give her colcrys, she was taken off all that for elevated creatinine.  If no improvement then will get MRI of foot

## 2013-03-07 NOTE — Telephone Encounter (Signed)
I called and gave additional clinical, this has been authorized. Lupita Leash will get this scheduled, and see if okay for contrast. To you FYI

## 2013-03-07 NOTE — Telephone Encounter (Signed)
Discussed with Lupita Leash and with Dr Conley Rolls, this is in process.

## 2013-03-07 NOTE — Telephone Encounter (Signed)
BUN 37 creat 1.61 will you  ask if this is okay for contrast ? I called [employer] for this Candace Robbins has reviewed case and it has been authorized the authorization number [id number] Exp Dec 25th.

## 2013-03-13 ENCOUNTER — Other Ambulatory Visit: Payer: Self-pay | Admitting: Family Medicine

## 2013-03-13 MED ORDER — PREDNISONE 20 MG PO TABS: 20.0000 mg | ORAL_TABLET | Freq: Every day | ORAL | Status: DC

## 2013-03-25 ENCOUNTER — Telehealth: Payer: Self-pay | Admitting: *Deleted

## 2013-03-25 NOTE — Telephone Encounter (Signed)
Called patient left message with female to call back message from Dr. Conley Rolls.

## 2013-03-28 NOTE — Telephone Encounter (Signed)
           Can you call and ask her if she is still having foot pain since she has been on steroid for some time now. We were trying to rule out gout vs osteomyelitis, I just did not suspect osteo after I saw her blood work but if she has no to little improvement of foot then we need to get that MRI. Patient is aware of my concerns based on last discussion. Thanks TLE

## 2013-03-28 NOTE — Telephone Encounter (Signed)
Message copied by Caffie Damme on Mon Mar 28, 2013 11:57 AM ------      Message from: Hamilton Capri P      Created: Thu Mar 24, 2013 11:56 AM       Can you call and ask her if she is still having foot pain since she has been on steroid for some time now. We were trying to rule out gout vs osteomyelitis, I just did not suspect osteo after I saw her blood work but if she has no to little improvement of foot then we need to get that MRI. Patient is aware of my concerns based on last discussion. Thanks TLE ------

## 2013-03-31 NOTE — Telephone Encounter (Signed)
Called again. Left message/ sent unable to reach letter.

## 2013-04-06 ENCOUNTER — Telehealth: Payer: Self-pay | Admitting: Family Medicine

## 2013-04-06 NOTE — Telephone Encounter (Signed)
LM with nephew Katherina Right to ask her to call me back to see how her foot is doing.

## 2013-04-12 ENCOUNTER — Telehealth: Payer: Self-pay | Admitting: Family Medicine

## 2013-04-12 DIAGNOSIS — M109 Gout, unspecified: Secondary | ICD-10-CM

## 2013-04-12 MED ORDER — ALLOPURINOL 100 MG PO TABS: 100.0000 mg | ORAL_TABLET | Freq: Every day | ORAL | Status: DC

## 2013-04-12 NOTE — Telephone Encounter (Signed)
Spoke with patient. Will try maintenance meds now that gout flare gone, will try low dose allopurinol 100 mg since she has a h/o CKD GFR calculated was 31.8 from last labs. She will come in 2 weeks to get a repeat BMP, uric acid  Level and also see how her foot looks

## 2013-10-14 ENCOUNTER — Inpatient Hospital Stay (HOSPITAL_COMMUNITY)
Admission: EM | Admit: 2013-10-14 | Discharge: 2013-10-16 | DRG: 683 | Disposition: A | Discharge status: Home / Self Care | Payer: Medicare Other | Attending: Internal Medicine | Admitting: Internal Medicine

## 2013-10-14 ENCOUNTER — Emergency Department (HOSPITAL_COMMUNITY): Payer: Medicare Other

## 2013-10-14 ENCOUNTER — Encounter (HOSPITAL_COMMUNITY): Payer: Self-pay | Admitting: Emergency Medicine

## 2013-10-14 DIAGNOSIS — R9409 Abnormal results of other function studies of central nervous system: Secondary | ICD-10-CM

## 2013-10-14 DIAGNOSIS — B951 Streptococcus, group B, as the cause of diseases classified elsewhere: Secondary | ICD-10-CM | POA: Diagnosis present

## 2013-10-14 DIAGNOSIS — N19 Unspecified kidney failure: Secondary | ICD-10-CM | POA: Insufficient documentation

## 2013-10-14 DIAGNOSIS — N183 Chronic kidney disease, stage 3 unspecified: Secondary | ICD-10-CM | POA: Diagnosis present

## 2013-10-14 DIAGNOSIS — I129 Hypertensive chronic kidney disease with stage 1 through stage 4 chronic kidney disease, or unspecified chronic kidney disease: Secondary | ICD-10-CM | POA: Diagnosis present

## 2013-10-14 DIAGNOSIS — E860 Dehydration: Secondary | ICD-10-CM | POA: Diagnosis present

## 2013-10-14 DIAGNOSIS — R413 Other amnesia: Secondary | ICD-10-CM

## 2013-10-14 DIAGNOSIS — M109 Gout, unspecified: Secondary | ICD-10-CM

## 2013-10-14 DIAGNOSIS — K219 Gastro-esophageal reflux disease without esophagitis: Secondary | ICD-10-CM | POA: Diagnosis present

## 2013-10-14 DIAGNOSIS — Z79899 Other long term (current) drug therapy: Secondary | ICD-10-CM

## 2013-10-14 DIAGNOSIS — I609 Nontraumatic subarachnoid hemorrhage, unspecified: Secondary | ICD-10-CM

## 2013-10-14 DIAGNOSIS — S0990XA Unspecified injury of head, initial encounter: Secondary | ICD-10-CM

## 2013-10-14 DIAGNOSIS — L089 Local infection of the skin and subcutaneous tissue, unspecified: Secondary | ICD-10-CM

## 2013-10-14 DIAGNOSIS — I959 Hypotension, unspecified: Secondary | ICD-10-CM | POA: Diagnosis present

## 2013-10-14 DIAGNOSIS — N179 Acute kidney failure, unspecified: Principal | ICD-10-CM

## 2013-10-14 DIAGNOSIS — N390 Urinary tract infection, site not specified: Secondary | ICD-10-CM | POA: Diagnosis present

## 2013-10-14 DIAGNOSIS — E785 Hyperlipidemia, unspecified: Secondary | ICD-10-CM | POA: Diagnosis present

## 2013-10-14 DIAGNOSIS — E869 Volume depletion, unspecified: Secondary | ICD-10-CM

## 2013-10-14 DIAGNOSIS — Z7982 Long term (current) use of aspirin: Secondary | ICD-10-CM

## 2013-10-14 DIAGNOSIS — R55 Syncope and collapse: Secondary | ICD-10-CM

## 2013-10-14 DIAGNOSIS — I10 Essential (primary) hypertension: Secondary | ICD-10-CM

## 2013-10-14 DIAGNOSIS — S0100XA Unspecified open wound of scalp, initial encounter: Secondary | ICD-10-CM

## 2013-10-14 DIAGNOSIS — J209 Acute bronchitis, unspecified: Secondary | ICD-10-CM

## 2013-10-14 HISTORY — DX: Syncope and collapse: R55

## 2013-10-14 LAB — COMPREHENSIVE METABOLIC PANEL
ALBUMIN: 4 g/dL (ref 3.5–5.2)
ALT: 20 U/L (ref 0–35)
AST: 29 U/L (ref 0–37)
Alkaline Phosphatase: 84 U/L (ref 39–117)
BILIRUBIN TOTAL: 0.6 mg/dL (ref 0.3–1.2)
BUN: 66 mg/dL — ABNORMAL HIGH (ref 6–23)
CHLORIDE: 92 meq/L — AB (ref 96–112)
CO2: 23 mEq/L (ref 19–32)
CREATININE: 3.78 mg/dL — AB (ref 0.50–1.10)
Calcium: 10.4 mg/dL (ref 8.4–10.5)
GFR calc Af Amer: 13 mL/min — ABNORMAL LOW (ref >90)
GFR calc non Af Amer: 11 mL/min — ABNORMAL LOW (ref >90)
Glucose, Bld: 91 mg/dL (ref 70–99)
Potassium: 3.3 mEq/L — ABNORMAL LOW (ref 3.7–5.3)
SODIUM: 135 meq/L — AB (ref 137–147)
Total Protein: 7.9 g/dL (ref 6.0–8.3)

## 2013-10-14 LAB — URINALYSIS, ROUTINE W REFLEX MICROSCOPIC
Glucose, UA: NEGATIVE mg/dL
Hgb urine dipstick: NEGATIVE
Ketones, ur: NEGATIVE mg/dL
Nitrite: NEGATIVE
PH: 5 (ref 5.0–8.0)
Protein, ur: 30 mg/dL — AB
Specific Gravity, Urine: 1.015 (ref 1.005–1.030)
Urobilinogen, UA: 1 mg/dL (ref 0.0–1.0)

## 2013-10-14 LAB — CBC WITH DIFFERENTIAL/PLATELET
BASOS ABS: 0 10*3/uL (ref 0.0–0.1)
BASOS PCT: 0 % (ref 0–1)
Eosinophils Absolute: 0.1 10*3/uL (ref 0.0–0.7)
Eosinophils Relative: 3 % (ref 0–5)
HEMATOCRIT: 31.2 % — AB (ref 36.0–46.0)
Hemoglobin: 10.7 g/dL — ABNORMAL LOW (ref 12.0–15.0)
Lymphocytes Relative: 39 % (ref 12–46)
Lymphs Abs: 1.9 10*3/uL (ref 0.7–4.0)
MCH: 30.7 pg (ref 26.0–34.0)
MCHC: 34.3 g/dL (ref 30.0–36.0)
MCV: 89.7 fL (ref 78.0–100.0)
Monocytes Absolute: 0.5 10*3/uL (ref 0.1–1.0)
Monocytes Relative: 11 % (ref 3–12)
NEUTROS ABS: 2.3 10*3/uL (ref 1.7–7.7)
NEUTROS PCT: 47 % (ref 43–77)
PLATELETS: 131 10*3/uL — AB (ref 150–400)
RBC: 3.48 MIL/uL — ABNORMAL LOW (ref 3.87–5.11)
RDW: 13.5 % (ref 11.5–15.5)
WBC: 4.9 10*3/uL (ref 4.0–10.5)

## 2013-10-14 LAB — I-STAT CG4 LACTIC ACID, ED: Lactic Acid, Venous: 1.38 mmol/L (ref 0.5–2.2)

## 2013-10-14 LAB — URINE MICROSCOPIC-ADD ON

## 2013-10-14 LAB — TROPONIN I: Troponin I: <0.3 ng/mL (ref <0.30)

## 2013-10-14 MED ORDER — ACETAMINOPHEN 650 MG RE SUPP: 650.0000 mg | Freq: Four times a day (QID) | RECTAL | Status: DC | PRN

## 2013-10-14 MED ORDER — SODIUM CHLORIDE 0.9 % IV BOLUS (SEPSIS)
30.0000 mL/kg | Freq: Once | INTRAVENOUS | Status: AC
  Administered 2013-10-14: 2448 mL via INTRAVENOUS

## 2013-10-14 MED ORDER — SUPER B COMPLEX PO TABS: ORAL_TABLET | Freq: Every morning | ORAL | Status: DC

## 2013-10-14 MED ORDER — CEFTRIAXONE SODIUM 1 G IJ SOLR
1.0000 g | INTRAMUSCULAR | Status: DC
  Administered 2013-10-14 – 2013-10-15 (×2): 1 g via INTRAVENOUS
  Filled 2013-10-14 (×2): qty 10

## 2013-10-14 MED ORDER — HEPARIN SODIUM (PORCINE) 5000 UNIT/ML IJ SOLN
5000.0000 [IU] | Freq: Three times a day (TID) | INTRAMUSCULAR | Status: DC
  Administered 2013-10-14 – 2013-10-16 (×5): 5000 [IU] via SUBCUTANEOUS
  Filled 2013-10-14 (×8): qty 1

## 2013-10-14 MED ORDER — SODIUM CHLORIDE 0.9 % IV SOLN
1000.0000 mL | INTRAVENOUS | Status: DC
  Administered 2013-10-14: 1000 mL via INTRAVENOUS

## 2013-10-14 MED ORDER — SODIUM CHLORIDE 0.9 % IV SOLN
INTRAVENOUS | Status: DC
  Administered 2013-10-14 – 2013-10-16 (×5): via INTRAVENOUS

## 2013-10-14 MED ORDER — TRAMADOL HCL 50 MG PO TABS
50.0000 mg | ORAL_TABLET | Freq: Two times a day (BID) | ORAL | Status: DC | PRN
  Filled 2013-10-14: qty 1

## 2013-10-14 MED ORDER — ASPIRIN 81 MG PO TABS: 81.0000 mg | ORAL_TABLET | Freq: Every day | ORAL | Status: DC

## 2013-10-14 MED ORDER — DEXTROSE 5 % IV SOLN
500.0000 mg | INTRAVENOUS | Status: DC
  Administered 2013-10-14 – 2013-10-15 (×2): 500 mg via INTRAVENOUS
  Filled 2013-10-14 (×2): qty 500

## 2013-10-14 MED ORDER — ASPIRIN 81 MG PO CHEW
81.0000 mg | CHEWABLE_TABLET | Freq: Every day | ORAL | Status: DC
  Administered 2013-10-15 – 2013-10-16 (×2): 81 mg via ORAL
  Filled 2013-10-14 (×2): qty 1

## 2013-10-14 MED ORDER — SIMVASTATIN 20 MG PO TABS
20.0000 mg | ORAL_TABLET | Freq: Every evening | ORAL | Status: DC
  Administered 2013-10-14 – 2013-10-15 (×2): 20 mg via ORAL
  Filled 2013-10-14 (×3): qty 1

## 2013-10-14 MED ORDER — SODIUM CHLORIDE 0.9 % IJ SOLN
3.0000 mL | Freq: Two times a day (BID) | INTRAMUSCULAR | Status: DC
  Administered 2013-10-14 – 2013-10-16 (×2): 3 mL via INTRAVENOUS

## 2013-10-14 MED ORDER — B COMPLEX-C PO TABS
1.0000 | ORAL_TABLET | Freq: Every day | ORAL | Status: DC
  Administered 2013-10-15 – 2013-10-16 (×2): 1 via ORAL
  Filled 2013-10-14 (×2): qty 1

## 2013-10-14 MED ORDER — GUAIFENESIN ER 600 MG PO TB12
600.0000 mg | ORAL_TABLET | Freq: Two times a day (BID) | ORAL | Status: DC
  Administered 2013-10-14 – 2013-10-16 (×4): 600 mg via ORAL
  Filled 2013-10-14 (×5): qty 1

## 2013-10-14 MED ORDER — ACETAMINOPHEN 325 MG PO TABS
650.0000 mg | ORAL_TABLET | Freq: Four times a day (QID) | ORAL | Status: DC | PRN
  Filled 2013-10-14: qty 2

## 2013-10-14 NOTE — ED Notes (Signed)
Pt told to report to ED [facility] stating she needs IV fluids for Creatnine of 3.69 and GFR of 15 as well as high uric acid. Pt was seen by PCP today for ?sinus infection.

## 2013-10-14 NOTE — ED Notes (Signed)
Pt c/o dizziness for the past couple of days. Pt denies N/V/D. A&Ox4. Pt c/o generalized weakness.

## 2013-10-14 NOTE — Progress Notes (Signed)
  CARE MANAGEMENT ED NOTE 10/14/2013  Patient:  Candace Robbins,Candace Robbins   Account Number:  [id number]  Date Initiated:  10/14/2013  Documentation initiated by:  Radford PaxFERRERO,AMY  Subjective/Objective Assessment:   Patient presents to ED with dizziness and generalized weakness.     Subjective/Objective Assessment Detail:   Bun 66, creat 3.78     Action/Plan:   Action/Plan Detail:   Anticipated DC Date:       Status Recommendation to Physician:   Result of Recommendation:    Other ED Services  Consult Working Plan    DC Planning Services  Other  PCP issues    Choice offered to / List presented to:            Status of service:  Completed, signed off  ED Comments:   ED Comments Detail:  EDCM spoke to patient at bedside.  Patient confirms her pcp is Dr[facility] on [address]  System updated.

## 2013-10-14 NOTE — ED Provider Notes (Signed)
CSN: [id number]     Arrival date & time 10/14/13  1926 History   First MD Initiated Contact with Patient 10/14/13 1936     Chief Complaint  Patient presents with  . Abnormal Lab     (Consider location/radiation/quality/duration/timing/severity/associated sxs/prior Treatment) HPI A 68 year old female with a history of chronic renal insufficiency and gout who presents today stating that [facility] She states that she went in there secondary to a sinus infection that consists of  a cough productive of yellowish to green sputum. She was told that she needed to come to the hospital as her creatinine was elevated at 3.69.  She drove herself from home.  She has not been eating as well as usual for two weeks and has noted that the room appeared to be going black when she stands up for the past two days.   She states that her creatinine has been high in the past but she does not know what it has been. She has not had it checked since November. She denies nausea, vomiting, diarrhea, or decreased by mouth intake. She has had some cough productive of yellowish to them. She does not think that she has had a fever with this.  Past Medical History  Diagnosis Date  . Allergy   . Arthritis   . GERD (gastroesophageal reflux disease)   . Chronic kidney disease   . Neuromuscular disorder   . Cataract    Past Surgical History  Procedure Laterality Date  . Breast surgery     Family History  Problem Relation Age of Onset  . Cancer Mother    History  Substance Use Topics  . Smoking status: Never Smoker   . Smokeless tobacco: Not on file  . Alcohol Use: No   OB History   Grav Para Term Preterm Abortions TAB SAB Ect Mult Living                 Review of Systems  Constitutional: Positive for activity change, appetite change and fatigue.  HENT: Positive for congestion and sinus pressure.   Eyes: Negative.   Respiratory: Positive for cough.   Cardiovascular: Negative.    Gastrointestinal: Negative.   Endocrine: Negative.   Genitourinary: Positive for decreased urine volume.  Musculoskeletal: Negative.   Skin: Negative.   Allergic/Immunologic: Negative.   Neurological: Positive for light-headedness.  Hematological: Negative.   Psychiatric/Behavioral: Negative.   All other systems reviewed and are negative.     Allergies  Review of patient's allergies indicates no known allergies.  Home Medications   Prior to Admission medications   Medication Sig Start Date End Date Taking? Authorizing Provider  aspirin 81 MG tablet Take 81 mg by mouth daily.   Yes Historical Provider, MD  B Complex-C (SUPER B COMPLEX PO) Take 1 tablet by mouth daily.   Yes Historical Provider, MD  Calcium Carb-Cholecalciferol (CALCIUM 600/VITAMIN D3) 600-800 MG-UNIT TABS Take 1 tablet by mouth daily.   Yes Historical Provider, MD  Chlorphen-Pseudoephed-APAP (CORICIDIN D PO) Take 1 tablet by mouth daily as needed (cough).   Yes Historical Provider, MD  Multiple Vitamins-Minerals (CENTRUM SILVER PO) Take 1 tablet by mouth daily.   Yes Historical Provider, MD  simvastatin (ZOCOR) 20 MG tablet Take 20 mg by mouth every evening.   Yes Historical Provider, MD  traMADol (ULTRAM) 50 MG tablet Take 50 mg by mouth 2 (two) times daily as needed (arthritis pain).    Yes Historical Provider, MD   BP  110/91  Pulse 76  Temp(Src) 98.2 F (36.8 C) (Oral)  Resp 12  Ht 5' 7.5" (1.715 m)  Wt 180 lb (81.647 kg)  BMI 27.76 kg/m2  SpO2 98% Physical Exam  ED Course  Procedures (including critical care time) Labs Review Labs Reviewed  CBC WITH DIFFERENTIAL - Abnormal; Notable for the following:    RBC 3.48 (*)    Hemoglobin 10.7 (*)    HCT 31.2 (*)    Platelets 131 (*)    All other components within normal limits  COMPREHENSIVE METABOLIC PANEL - Abnormal; Notable for the following:    Sodium 135 (*)    Potassium 3.3 (*)    Chloride 92 (*)    BUN 66 (*)    Creatinine, Ser 3.78 (*)     GFR calc non Af Amer 11 (*)    GFR calc Af Amer 13 (*)    All other components within normal limits  CULTURE, BLOOD (ROUTINE X 2)  CULTURE, BLOOD (ROUTINE X 2)  URINE CULTURE  URINALYSIS, ROUTINE W REFLEX MICROSCOPIC  I-STAT CG4 LACTIC ACID, ED    Imaging Review Dg Chest Port 1 View  10/14/2013   CLINICAL DATA:  Renal insufficiency, sinus infection, history GERD  EXAM: PORTABLE CHEST - 1 VIEW  COMPARISON:  Portable exam 2032 hr compared to 07/28/2009  FINDINGS: Normal heart size, mediastinal contours, and pulmonary vascularity.  Lungs clear.  No pleural effusion or pneumothorax.  Bones unremarkable.  IMPRESSION: No acute abnormalities.   Electronically Signed   By: Ulyses SouthwardMark  Boles M.D.   On: 10/14/2013 20:41     EKG Interpretation   Date/Time:  Friday October 14 2013 20:12:11 EDT Ventricular Rate:  76 PR Interval:  189 QRS Duration: 78 QT Interval:  387 QTC Calculation: 435 R Axis:   45 Text Interpretation:  Normal sinus rhythm No significant change was found  from prior ekg Confirmed by RAY MD, Duwayne HeckANIELLE [phone]([id number]) on 10/14/2013 9:02:06  PM      MDM   Final diagnoses:  Volume depletion  Renal failure    Initial bp low with sbp 80s, patient has had iv fluids and now sbp about 100.  Plan to continue iv hydration and assess for renal function.     Hilario Quarryanielle S Ray, MD 10/14/13 [phone]

## 2013-10-14 NOTE — ED Notes (Signed)
Attempted to call report, RN unavailable at this time. Floor RN will call back.

## 2013-10-14 NOTE — H&P (Signed)
[facility] History and Physical  Patient: Candace Robbins  ZOX:[id number]  DOB: 16-Jul-1945  DOS: the patient was seen and examined on 10/14/2013 PCP: No primary provider on file.  Chief Complaint: Abnormal lab and cough  HPI: Candace Robbins is a 68 y.o. female with Past medical history of GERD, hypertension, chronic kidney disease. Patient presented with abnormal lab. She mentions that she has a cough since last one month which is progressively getting worse. With that she has been sputum. She denies any shortness of breath. She denies any orthopnea or PND. She comments on some postnasal drip. No chest pain. No fever no chills. She denies any rash anywhere. She denies any diarrhea or constipation. She denies any nausea or vomiting. She mentions she has poor appetite due to her progressive cough. She also mentions that she has been having dizziness with near syncopal events in the last 1 week whenever she changes her position. She denies any palpitation. She denies any passing out episodes. She mentions she was continuing taking her lisinopril and hydrochlorothiazide. She was also taking allopurinol. She has family history of renal failure in her mother and nephew.  The patient is coming from home. And at her baseline independent for most of her ADL.  Review of Systems: as mentioned in the history of present illness.  A Comprehensive review of the other systems is negative.  Past Medical History  Diagnosis Date  . Allergy   . Arthritis   . GERD (gastroesophageal reflux disease)   . Chronic kidney disease   . Neuromuscular disorder   . Cataract   . Syncope    Past Surgical History  Procedure Laterality Date  . Breast surgery    . Scalp laceration repair     Social History:  reports that she has never smoked. She has never used smokeless tobacco. She reports that she does not drink alcohol or use illicit drugs.  No Known Allergies  Family History  Problem Relation Age of  Onset  . Cancer Mother     Prior to Admission medications   Medication Sig Start Date End Date Taking? Authorizing Provider  aspirin 81 MG tablet Take 81 mg by mouth daily.   Yes Historical Provider, MD  B Complex-C (SUPER B COMPLEX PO) Take 1 tablet by mouth daily.   Yes Historical Provider, MD  Calcium Carb-Cholecalciferol (CALCIUM 600/VITAMIN D3) 600-800 MG-UNIT TABS Take 1 tablet by mouth daily.   Yes Historical Provider, MD  Chlorphen-Pseudoephed-APAP (CORICIDIN D PO) Take 1 tablet by mouth daily as needed (cough).   Yes Historical Provider, MD  ibuprofen (ADVIL,MOTRIN) 200 MG tablet Take 200 mg by mouth every 6 (six) hours as needed.   Yes Historical Provider, MD  lisinopril (PRINIVIL,ZESTRIL) 10 MG tablet Take 10 mg by mouth daily.   Yes Historical Provider, MD  Multiple Vitamins-Minerals (CENTRUM SILVER PO) Take 1 tablet by mouth daily.   Yes Historical Provider, MD  simvastatin (ZOCOR) 20 MG tablet Take 20 mg by mouth every evening.   Yes Historical Provider, MD  traMADol (ULTRAM) 50 MG tablet Take 50 mg by mouth 2 (two) times daily as needed (arthritis pain).    Yes Historical Provider, MD    Physical Exam: Filed Vitals:   10/14/13 2030 10/14/13 2049 10/14/13 2108 10/14/13 2145  BP: 96/71 110/91 111/75 117/90  Pulse: 73 76 74 74  Temp:      TempSrc:      Resp: 22 12 19 21   Height:  Weight:      SpO2: 95% 98% 98% 100%    General: Alert, Awake and Oriented to Time, Place and Person. Appear in mild distress Eyes: PERRL ENT: Oral Mucosa clear dry . Neck: no JVD Cardiovascular: S1 and S2 Present, aortic systolic Murmur, Peripheral Pulses Present Respiratory: Bilateral Air entry equal and Decreased, Clear to Auscultation,  no Crackles,no wheezes Abdomen: Bowel Sound Present, Soft and Non tender Skin: no Rash Extremities: no Pedal edema, no calf tenderness Neurologic: Grossly no focal neuro deficit. Labs on Admission:  CBC:  Recent Labs Lab 10/14/13 2007  WBC 4.9   NEUTROABS 2.3  HGB 10.7*  HCT 31.2*  MCV 89.7  PLT 131*    CMP     Component Value Date/Time   NA 135* 10/14/2013 2007   K 3.3* 10/14/2013 2007   CL 92* 10/14/2013 2007   CO2 23 10/14/2013 2007   GLUCOSE 91 10/14/2013 2007   BUN 66* 10/14/2013 2007   CREATININE 3.78* 10/14/2013 2007   CREATININE 1.61* 03/03/2013 2030   CALCIUM 10.4 10/14/2013 2007   PROT 7.9 10/14/2013 2007   ALBUMIN 4.0 10/14/2013 2007   AST 29 10/14/2013 2007   ALT 20 10/14/2013 2007   ALKPHOS 84 10/14/2013 2007   BILITOT 0.6 10/14/2013 2007   GFRNONAA 11* 10/14/2013 2007   GFRAA 13* 10/14/2013 2007    No results found for this basename: LIPASE, AMYLASE,  in the last 168 hours No results found for this basename: AMMONIA,  in the last 168 hours  No results found for this basename: CKTOTAL, CKMB, CKMBINDEX, TROPONINI,  in the last 168 hours BNP (last 3 results) No results found for this basename: PROBNP,  in the last 8760 hours  Radiological Exams on Admission: Dg Chest Port 1 View  10/14/2013   CLINICAL DATA:  Renal insufficiency, sinus infection, history GERD  EXAM: PORTABLE CHEST - 1 VIEW  COMPARISON:  Portable exam 2032 hr compared to 07/28/2009  FINDINGS: Normal heart size, mediastinal contours, and pulmonary vascularity.  Lungs clear.  No pleural effusion or pneumothorax.  Bones unremarkable.  IMPRESSION: No acute abnormalities.   Electronically Signed   By: Ulyses Southward M.D.   On: 10/14/2013 20:41     Assessment/Plan Principal Problem:   AKI (acute kidney injury) Active Problems:   HYPERLIPIDEMIA   HYPERTENSION   Acute bronchitis   Near syncope   Dehydration   1. AKI (acute kidney injury) The patient presents with complaints of cough. Her PCP updated primary workup which showed acute on chronic renal failure. Patient has mild elevation of sedimentation at her baseline of unknown etiology. At present heart creatinine has increased to 3. She has near syncopal events and appears dehydrated on examination  which could probably explain prerenal etiology. She was also on lisinopril hydrochlorothiazide. Also she has taken ibuprofen for 2 days for leg pain one week ago 4 tablets daily. At present she has received IV fluid bolus and we will continue hydrate her. Recheck BMP in morning. Ultrasound of her kidney for further workup. Will need nephrologist in morning. Also holding lisinopril and hydrochlorothiazide due to elevated serum creatinine. She may require a different antihypertensive medications.  2. hypertension Holding lisinopril and hydrochlorothiazide. Patient at present is hypotensive.  3. acute bronchitis Possible UTI IV ceftriaxone IV azithromycin. The patient was at prescribed today amoxicillin 875 as an outpatient.  DVT Prophylaxis: subcutaneous Heparin Nutrition: Renal diet   Code Status: Full   Disposition: Admitted to inpatient in telemetry unit.  Author:  Lynden OxfordPranav Patel, MD [facility] Pager: [phone] 10/14/2013, 10:01 PM    If 7PM-7AM, please contact night-coverage [email or web address] Password [email or web address]

## 2013-10-15 ENCOUNTER — Inpatient Hospital Stay (HOSPITAL_COMMUNITY): Payer: Medicare Other

## 2013-10-15 DIAGNOSIS — J209 Acute bronchitis, unspecified: Secondary | ICD-10-CM | POA: Diagnosis present

## 2013-10-15 DIAGNOSIS — E869 Volume depletion, unspecified: Secondary | ICD-10-CM

## 2013-10-15 DIAGNOSIS — N183 Chronic kidney disease, stage 3 unspecified: Secondary | ICD-10-CM

## 2013-10-15 DIAGNOSIS — E860 Dehydration: Secondary | ICD-10-CM

## 2013-10-15 DIAGNOSIS — R55 Syncope and collapse: Secondary | ICD-10-CM

## 2013-10-15 LAB — TROPONIN I: Troponin I: <0.3 ng/mL (ref <0.30)

## 2013-10-15 LAB — COMPREHENSIVE METABOLIC PANEL
ALBUMIN: 3.1 g/dL — AB (ref 3.5–5.2)
ALK PHOS: 64 U/L (ref 39–117)
ALT: 15 U/L (ref 0–35)
AST: 22 U/L (ref 0–37)
BILIRUBIN TOTAL: 0.3 mg/dL (ref 0.3–1.2)
BUN: 60 mg/dL — AB (ref 6–23)
CO2: 24 mEq/L (ref 19–32)
Calcium: 8.8 mg/dL (ref 8.4–10.5)
Chloride: 102 mEq/L (ref 96–112)
Creatinine, Ser: 3.19 mg/dL — ABNORMAL HIGH (ref 0.50–1.10)
GFR calc Af Amer: 16 mL/min — ABNORMAL LOW (ref >90)
GFR calc non Af Amer: 14 mL/min — ABNORMAL LOW (ref >90)
Glucose, Bld: 83 mg/dL (ref 70–99)
POTASSIUM: 3.8 meq/L (ref 3.7–5.3)
SODIUM: 139 meq/L (ref 137–147)
Total Protein: 6 g/dL (ref 6.0–8.3)

## 2013-10-15 LAB — STREP PNEUMONIAE URINARY ANTIGEN: Strep Pneumo Urinary Antigen: NEGATIVE

## 2013-10-15 LAB — PROTIME-INR
INR: 1.07 (ref 0.00–1.49)
Prothrombin Time: 13.7 seconds (ref 11.6–15.2)

## 2013-10-15 LAB — CBC
HCT: 25.5 % — ABNORMAL LOW (ref 36.0–46.0)
Hemoglobin: 8.8 g/dL — ABNORMAL LOW (ref 12.0–15.0)
MCH: 31.5 pg (ref 26.0–34.0)
MCHC: 34.5 g/dL (ref 30.0–36.0)
MCV: 91.4 fL (ref 78.0–100.0)
PLATELETS: 89 10*3/uL — AB (ref 150–400)
RBC: 2.79 MIL/uL — ABNORMAL LOW (ref 3.87–5.11)
RDW: 13.5 % (ref 11.5–15.5)
WBC: 5 10*3/uL (ref 4.0–10.5)

## 2013-10-15 LAB — TSH: TSH: 2.19 u[IU]/mL (ref 0.350–4.500)

## 2013-10-15 MED ORDER — CALCIUM CARBONATE ANTACID 500 MG PO CHEW
1.0000 | CHEWABLE_TABLET | Freq: Once | ORAL | Status: AC
  Administered 2013-10-15: 200 mg via ORAL
  Filled 2013-10-15: qty 1

## 2013-10-15 NOTE — Progress Notes (Signed)
[facility] PROGRESS NOTE  March Rummageancy B Mcgath ZOX:[id number] DOB: Mar 06, 1946 DOA: 10/14/2013 PCP: Johny BlamerHARRIS, WILLIAM, MD  Assessment/Plan: Acute on CKD Stage III -Acute component likely pre-renal azotemia. -Improving slowly with IVF. -Renal US negative for obstruction. -Holding ACE-I/HCTZ for now.  ?UTI -Continue rocephin pending cx data.  Acute Bronchitis -Is likely the etiology of her cough. -Continue azithromycin for 5 days total.  HTN -Stable despite holding ACE-I/HCTZ combo.  Code Status: Full Code Family Communication: Patient only  Disposition Plan: Home when ready.   Consultants:  None   Antibiotics:  Rocephin  Azithro   Subjective: Feels better.  Objective: Filed Vitals:   10/14/13 2200 10/14/13 2247 10/15/13 0510 10/15/13 0543  BP: 115/79 130/48 110/62   Pulse: 87 77 70   Temp:  97.6 F (36.4 C) 97.6 F (36.4 C)   TempSrc:  Oral Oral   Resp: 19 18 16    Height:  5' 7.5" (1.715 m)    Weight:  82.6 kg (182 lb 1.6 oz)  82.963 kg (182 lb 14.4 oz)  SpO2: 93% 100% 100%     Intake/Output Summary (Last 24 hours) at 10/15/13 1339 Last data filed at 10/15/13 1100  Gross per 24 hour  Intake   1680 ml  Output    500 ml  Net   1180 ml   Filed Weights   10/14/13 1940 10/14/13 2247 10/15/13 0543  Weight: 81.647 kg (180 lb) 82.6 kg (182 lb 1.6 oz) 82.963 kg (182 lb 14.4 oz)    Exam:   General:  AA Ox3  Cardiovascular: CTA B  Respiratory: CTA B  Abdomen: S/NT/ND/+BS  Extremities: no C/C/E   Neurologic:  Non-focal  Data Reviewed: Basic Metabolic Panel:  Recent Labs Lab 10/14/13 2007 10/15/13 0413  NA 135* 139  K 3.3* 3.8  CL 92* 102  CO2 23 24  GLUCOSE 91 83  BUN 66* 60*  CREATININE 3.78* 3.19*  CALCIUM 10.4 8.8   Liver Function Tests:  Recent Labs Lab 10/14/13 2007 10/15/13 0413  AST 29 22  ALT 20 15  ALKPHOS 84 64  BILITOT 0.6 0.3  PROT 7.9 6.0  ALBUMIN 4.0 3.1*   No results found for this basename: LIPASE,  AMYLASE,  in the last 168 hours No results found for this basename: AMMONIA,  in the last 168 hours CBC:  Recent Labs Lab 10/14/13 2007 10/15/13 0413  WBC 4.9 5.0  NEUTROABS 2.3  --   HGB 10.7* 8.8*  HCT 31.2* 25.5*  MCV 89.7 91.4  PLT 131* 89*   Cardiac Enzymes:  Recent Labs Lab 10/14/13 2201 10/15/13 0413 10/15/13 0935  TROPONINI <0.30 <0.30 <0.30   BNP (last 3 results) No results found for this basename: PROBNP,  in the last 8760 hours CBG: No results found for this basename: GLUCAP,  in the last 168 hours  No results found for this or any previous visit (from the past 240 hour(s)).   Studies: [facility]  10/15/2013   CLINICAL DATA:  Acute renal injury  EXAM: RENAL/URINARY TRACT ULTRASOUND COMPLETE  COMPARISON:  Abdomen ultrasound 01/08/2011  FINDINGS: Right Kidney:  Length: 10.7 cm. Normal cortical thickness and echogenicity. No mass, hydronephrosis or shadowing calcification.  Left Kidney:  Length: 11.0 cm length. Normal cortical thickness and echogenicity. Tiny LEFT renal hypoechoic nodules question cysts, upper pole 11 x 10 x 11 mm seen previously and lower pole 11 x 10 x 8 mm not definitely seen previously. No definite solid mass or hydronephrosis. Shadowing calcification.  Bladder:  Normal appearance  IMPRESSION: Question tiny LEFT renal cysts.  Otherwise negative exam.   Electronically Signed   By: Ulyses SouthwardMark  Boles M.D.   On: 10/15/2013 10:51   Dg Chest Port 1 View  10/14/2013   CLINICAL DATA:  Renal insufficiency, sinus infection, history GERD  EXAM: PORTABLE CHEST - 1 VIEW  COMPARISON:  Portable exam 2032 hr compared to 07/28/2009  FINDINGS: Normal heart size, mediastinal contours, and pulmonary vascularity.  Lungs clear.  No pleural effusion or pneumothorax.  Bones unremarkable.  IMPRESSION: No acute abnormalities.   Electronically Signed   By: Ulyses SouthwardMark  Boles M.D.   On: 10/14/2013 20:41    Scheduled Meds: . aspirin  81 mg Oral Daily  . azithromycin  500 mg Intravenous Q24H   . B-complex with vitamin C  1 tablet Oral Daily  . cefTRIAXone (ROCEPHIN)  IV  1 g Intravenous Q24H  . guaiFENesin  600 mg Oral BID  . heparin  5,000 Units Subcutaneous 3 times per day  . simvastatin  20 mg Oral QPM  . sodium chloride  3 mL Intravenous Q12H   Continuous Infusions: . sodium chloride 1,000 mL (10/14/13 2316)  . sodium chloride 100 mL/hr at 10/15/13 1326    Principal Problem:   ARF (acute renal failure) Active Problems:   HYPERLIPIDEMIA   HYPERTENSION   Acute bronchitis   Near syncope   Dehydration   CKD (chronic kidney disease) stage 3, GFR 30-59 ml/min    Time spent: 35 minutes. Greater than 50% of this time was spent in direct contact with the patient coordinating care.    Chaya JanHERNANDEZ ACOSTA,ESTELA  [facility] Pager [phone]  If 7PM-7AM, please contact night-coverage at [email or web address], password [email or web address] 10/15/2013, 1:39 PM  LOS: 1 day

## 2013-10-16 LAB — BASIC METABOLIC PANEL
BUN: 47 mg/dL — ABNORMAL HIGH (ref 6–23)
CO2: 23 meq/L (ref 19–32)
CREATININE: 2.2 mg/dL — AB (ref 0.50–1.10)
Calcium: 8.9 mg/dL (ref 8.4–10.5)
Chloride: 107 mEq/L (ref 96–112)
GFR calc Af Amer: 25 mL/min — ABNORMAL LOW (ref >90)
GFR calc non Af Amer: 22 mL/min — ABNORMAL LOW (ref >90)
GLUCOSE: 81 mg/dL (ref 70–99)
Potassium: 3.9 mEq/L (ref 3.7–5.3)
Sodium: 143 mEq/L (ref 137–147)

## 2013-10-16 LAB — CBC
HCT: 25.2 % — ABNORMAL LOW (ref 36.0–46.0)
Hemoglobin: 8.5 g/dL — ABNORMAL LOW (ref 12.0–15.0)
MCH: 30.8 pg (ref 26.0–34.0)
MCHC: 33.7 g/dL (ref 30.0–36.0)
MCV: 91.3 fL (ref 78.0–100.0)
PLATELETS: 92 10*3/uL — AB (ref 150–400)
RBC: 2.76 MIL/uL — ABNORMAL LOW (ref 3.87–5.11)
RDW: 13.5 % (ref 11.5–15.5)
WBC: 3.9 10*3/uL — AB (ref 4.0–10.5)

## 2013-10-16 LAB — LEGIONELLA ANTIGEN, URINE: LEGIONELLA ANTIGEN, URINE: NEGATIVE

## 2013-10-16 LAB — URINE CULTURE: Colony Count: >=100000

## 2013-10-16 MED ORDER — CEPHALEXIN 500 MG PO CAPS: 500.0000 mg | ORAL_CAPSULE | Freq: Two times a day (BID) | ORAL | Status: DC

## 2013-10-16 NOTE — Discharge Summary (Signed)
Physician Discharge Summary  Candace Robbins RUE:[id number] DOB: 1946-01-19 DOA: 10/14/2013  PCP: Johny BlamerHARRIS, WILLIAM, MD  Admit date: 10/14/2013 Discharge date: 10/16/2013  Time spent: 45 minutes  Recommendations for Outpatient Follow-up:  -Will be discharged home today. -Advised to follow up with PCP in 5 days to monitor renal function.   Discharge Diagnoses:  Principal Problem:   ARF (acute renal failure) Active Problems:   HYPERLIPIDEMIA   HYPERTENSION   Acute bronchitis   Near syncope   Dehydration   CKD (chronic kidney disease) stage 3, GFR 30-59 ml/min   Discharge Condition: Stable and improved  Filed Weights   10/14/13 2247 10/15/13 0543 10/16/13 0703  Weight: 82.6 kg (182 lb 1.6 oz) 82.963 kg (182 lb 14.4 oz) 83.779 kg (184 lb 11.2 oz)    History of present illness:  Candace Rummageancy B Dutkiewicz is a 68 y.o. female with Past medical history of GERD, hypertension, chronic kidney disease.  Patient presented with abnormal lab. She mentions that she has a cough since last one month which is progressively getting worse. With that she has been sputum. She denies any shortness of breath. She denies any orthopnea or PND. She comments on some postnasal drip. No chest pain. No fever no chills. She denies any rash anywhere. She denies any diarrhea or constipation. She denies any nausea or vomiting.  She mentions she has poor appetite due to her progressive cough.  She also mentions that she has been having dizziness with near syncopal events in the last 1 week whenever she changes her position. She denies any palpitation. She denies any passing out episodes.  She mentions she was continuing taking her lisinopril and hydrochlorothiazide. She was also taking allopurinol.  She has family history of renal failure in her mother and nephew.  The patient is coming from home. And at her baseline independent for most of her ADL. Hospitalist admission was requested.   Hospital Course:   Acute on CKD  Stage III  -Acute component likely pre-renal azotemia.  -Improving slowly with IVF: down to 2.20 from 3.3 on admission. It appears her baseline is around 1.6-1.7. -Renal US negative for obstruction.  -Holding ACE-I/HCTZ for now.   ?UTI  -Cx with group B strep. -Keflex for 7 days.  Acute Bronchitis  -Is likely the etiology of her cough.  -Probably viral in etiology. -No antibiotics on DC.  HTN  -Stable despite holding ACE-I/HCTZ combo. -PCP to follow BP at time of hospital follow up and decide whether ok to retstart based on renal function.   Procedures:  None   Consultations:  None  Discharge Instructions  Discharge Instructions   Diet - low sodium heart healthy    Complete by:  As directed      Discontinue IV    Complete by:  As directed      Increase activity slowly    Complete by:  As directed             Medication List    STOP taking these medications       allopurinol 100 MG tablet  Commonly known as:  ZYLOPRIM     lisinopril 10 MG tablet  Commonly known as:  PRINIVIL,ZESTRIL     lisinopril-hydrochlorothiazide 20-25 MG per tablet  Commonly known as:  PRINZIDE,ZESTORETIC      TAKE these medications       aspirin 81 MG tablet  Take 81 mg by mouth daily.     CALCIUM 600/VITAMIN D3 600-800 MG-UNIT Tabs  Generic drug:  Calcium Carb-Cholecalciferol  Take 1 tablet by mouth daily.     CENTRUM SILVER PO  Take 1 tablet by mouth daily.     cephALEXin 500 MG capsule  Commonly known as:  KEFLEX  Take 1 capsule (500 mg total) by mouth 2 (two) times daily. For 7 days     CORICIDIN D PO  Take 1 tablet by mouth daily as needed (cough).     ibuprofen 200 MG tablet  Commonly known as:  ADVIL,MOTRIN  Take 200 mg by mouth every 6 (six) hours as needed.     simvastatin 20 MG tablet  Commonly known as:  ZOCOR  Take 20 mg by mouth every evening.     SUPER B COMPLEX PO  Take 1 tablet by mouth daily.     traMADol 50 MG tablet  Commonly known as:   ULTRAM  Take 50 mg by mouth 2 (two) times daily as needed (arthritis pain).       No Known Allergies     Follow-up Information   Follow up with Johny Blamer, MD In 5 days. (To recheck your kidney function)    Specialty:  Family Medicine   Contact information:   [address] [place] [address] [phone]        The results of significant diagnostics from this hospitalization (including imaging, microbiology, ancillary and laboratory) are listed below for reference.    Significant Diagnostic Studies: US Renal  10/15/2013   CLINICAL DATA:  Acute renal injury  EXAM: RENAL/URINARY TRACT ULTRASOUND COMPLETE  COMPARISON:  Abdomen ultrasound 01/08/2011  FINDINGS: Right Kidney:  Length: 10.7 cm. Normal cortical thickness and echogenicity. No mass, hydronephrosis or shadowing calcification.  Left Kidney:  Length: 11.0 cm length. Normal cortical thickness and echogenicity. Tiny LEFT renal hypoechoic nodules question cysts, upper pole 11 x 10 x 11 mm seen previously and lower pole 11 x 10 x 8 mm not definitely seen previously. No definite solid mass or hydronephrosis. Shadowing calcification.  Bladder:  Normal appearance  IMPRESSION: Question tiny LEFT renal cysts.  Otherwise negative exam.   Electronically Signed   By: Ulyses Southward M.D.   On: 10/15/2013 10:51   Dg Chest Port 1 View  10/14/2013   CLINICAL DATA:  Renal insufficiency, sinus infection, history GERD  EXAM: PORTABLE CHEST - 1 VIEW  COMPARISON:  Portable exam 2032 hr compared to 07/28/2009  FINDINGS: Normal heart size, mediastinal contours, and pulmonary vascularity.  Lungs clear.  No pleural effusion or pneumothorax.  Bones unremarkable.  IMPRESSION: No acute abnormalities.   Electronically Signed   By: Ulyses Southward M.D.   On: 10/14/2013 20:41    Microbiology: Recent Results (from the past 240 hour(s))  CULTURE, BLOOD (ROUTINE X 2)     Status: None   Collection Time    10/14/13  8:07 PM      Result Value Ref Range  Status   Specimen Description BLOOD LEFT HAND   Final   Special Requests BOTTLES DRAWN AEROBIC AND ANAEROBIC 3CC   Final   Culture  Setup Time     Final   Value: 10/15/2013 00:45     Performed at [facility]   Culture     Final   Value:        BLOOD CULTURE RECEIVED NO GROWTH TO DATE CULTURE WILL BE HELD FOR 5 DAYS BEFORE ISSUING A FINAL NEGATIVE REPORT     Performed at [facility]   Report  Status PENDING   Incomplete  CULTURE, BLOOD (ROUTINE X 2)     Status: None   Collection Time    10/14/13  8:07 PM      Result Value Ref Range Status   Specimen Description BLOOD RIGHT ANTECUBITAL   Final   Special Requests BOTTLES DRAWN AEROBIC AND ANAEROBIC [facility]7CC   Final   Culture  Setup Time     Final   Value: 10/15/2013 00:46     [facility]formed at [facility]   Culture     Final   Value:        BLOOD CULTURE RECEIVED NO GROWTH TO DATE CULTURE WILL BE HELD FOR 5 DAYS BEFORE ISSUING A FINAL NEGATIVE REPORT     [facility]formed at [facility]   Report Status PENDING   Incomplete  URINE CULTURE     Status: None   Collection Time    10/14/13  9:01 PM      Result Value Ref Range Status   Specimen Description URINE, CLEAN CATCH   Final   Special Requests NONE   Final   Culture  Setup Time     Final   Value: 10/15/2013 00:49     [facility]formed at [facility]   Colony Count     Final   Value: >=100,000 COLONIES/ML     [facility]formed at [facility]   Culture     Final   Value: GROUP B STREP(S.AGALACTIAE)ISOLATED     Note: TESTING AGAINST S. AGALACTIAE NOT ROUTINELY PERFORMED DUE TO PREDICTABILITY OF AMP/PEN/VAN SUSCEPTIBILITY.     Performed at [facility]   Report Status 10/16/2013 FINAL   Final     Labs: Basic Metabolic Panel:  Recent Labs Lab 10/14/13 2007 10/15/13 0413 10/16/13 0424  NA 135* 139 143  K 3.3* 3.8 3.9  CL 92* 102 107  CO2 23 24 23   GLUCOSE 91 83 81  BUN 66* 60* 47*  CREATININE 3.78* 3.19* 2.20*  CALCIUM 10.4 8.8 8.9   Liver  Function Tests:  Recent Labs Lab 10/14/13 2007 10/15/13 0413  AST 29 22  ALT 20 15  ALKPHOS 84 64  BILITOT 0.6 0.3  PROT 7.9 6.0  ALBUMIN 4.0 3.1*   No results found for this basename: LIPASE, AMYLASE,  in the last 168 hours No results found for this basename: AMMONIA,  in the last 168 hours CBC:  Recent Labs Lab 10/14/13 2007 10/15/13 0413 10/16/13 0424  WBC 4.9 5.0 3.9*  NEUTROABS 2.3  --   --   HGB 10.7* 8.8* 8.5*  HCT 31.2* 25.5* 25.2*  MCV 89.7 91.4 91.3  PLT 131* 89* 92*   Cardiac Enzymes:  Recent Labs Lab 10/14/13 2201 10/15/13 0413 10/15/13 0935  TROPONINI <0.30 <0.30 <0.30   BNP: BNP (last 3 results) No results found for this basename: PROBNP,  in the last 8760 hours CBG: No results found for this basename: GLUCAP,  in the last 168 hours     Signed:  Chaya JanHERNANDEZ ACOSTA,ESTELA  [facility] Pager: [phone] 10/16/2013, 11:26 AM

## 2013-10-21 LAB — CULTURE, BLOOD (ROUTINE X 2)
CULTURE: NO GROWTH
Culture: NO GROWTH

## 2013-12-05 ENCOUNTER — Ambulatory Visit (INDEPENDENT_AMBULATORY_CARE_PROVIDER_SITE_OTHER): Payer: Medicare Other | Admitting: Neurology

## 2013-12-05 ENCOUNTER — Encounter: Payer: Self-pay | Admitting: Neurology

## 2013-12-05 VITALS — BP 102/70 | HR 76 | Resp 16 | Ht 67.5 in | Wt 185.0 lb

## 2013-12-05 DIAGNOSIS — R9402 Abnormal brain scan: Secondary | ICD-10-CM

## 2013-12-05 DIAGNOSIS — N189 Chronic kidney disease, unspecified: Secondary | ICD-10-CM

## 2013-12-05 DIAGNOSIS — R259 Unspecified abnormal involuntary movements: Secondary | ICD-10-CM

## 2013-12-05 DIAGNOSIS — G255 Other chorea: Secondary | ICD-10-CM

## 2013-12-05 DIAGNOSIS — R292 Abnormal reflex: Secondary | ICD-10-CM

## 2013-12-05 DIAGNOSIS — R9409 Abnormal results of other function studies of central nervous system: Secondary | ICD-10-CM

## 2013-12-05 DIAGNOSIS — R251 Tremor, unspecified: Secondary | ICD-10-CM

## 2013-12-05 LAB — CREATININE, SERUM: Creat: 1.19 mg/dL — ABNORMAL HIGH (ref 0.50–1.10)

## 2013-12-05 LAB — BUN: BUN: 21 mg/dL (ref 6–23)

## 2013-12-05 NOTE — Patient Instructions (Addendum)
[facility] 12/22/13 at 3:00pm. Please arrive 15 minutes prior and go to 1st floor radiology. If you need to reschedule for any reason please call [phone].
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
## 2013-12-22 ENCOUNTER — Ambulatory Visit (HOSPITAL_COMMUNITY)
Admission: RE | Admit: 2013-12-22 | Discharge: 2013-12-22 | Disposition: A | Discharge status: Home / Self Care | Payer: Medicare Other | Source: Ambulatory Visit | Attending: Neurology | Admitting: Neurology

## 2013-12-22 ENCOUNTER — Telehealth: Payer: Self-pay | Admitting: Neurology

## 2013-12-22 DIAGNOSIS — R292 Abnormal reflex: Secondary | ICD-10-CM

## 2013-12-22 DIAGNOSIS — R251 Tremor, unspecified: Secondary | ICD-10-CM

## 2013-12-22 NOTE — Telephone Encounter (Signed)
Meg with the MR department called and stated patient could not tolerate MR. Appt cancelled.

## 2013-12-23 NOTE — Telephone Encounter (Signed)
Tried to call patient to see what she wanted to do. No answer. Will try again later.

## 2013-12-27 MED ORDER — DIAZEPAM 5 MG PO TABS: 5.0000 mg | ORAL_TABLET | Freq: Once | ORAL | Status: DC

## 2013-12-27 NOTE — Telephone Encounter (Signed)
Yes.  Hopefully the po valium will be enough.  If not, will require IV sedation

## 2013-12-27 NOTE — Telephone Encounter (Signed)
Spoke with patient. She states she will need the open machine and probably medication to help her relax in order to get the MR. She can do this any day. Best number to reach patient back is [phone]. Okay to give patient medication?

## 2013-12-27 NOTE — Telephone Encounter (Signed)
Valium RX sent to patient's pharmacy. Patient aware not to take this until she arrives at facility and to have a driver. She is r/s for 01/06/2014 at 1:00 pm at [facility] in the OPEN machine and she was made aware.

## 2014-01-03 ENCOUNTER — Ambulatory Visit: Payer: Medicare Other | Admitting: Neurology

## 2014-01-06 ENCOUNTER — Other Ambulatory Visit: Payer: Self-pay | Admitting: Neurology

## 2014-01-06 ENCOUNTER — Ambulatory Visit
Admission: RE | Admit: 2014-01-06 | Discharge: 2014-01-06 | Disposition: A | Discharge status: Home / Self Care | Payer: Medicare Other | Source: Ambulatory Visit | Attending: Neurology | Admitting: Neurology

## 2014-01-06 ENCOUNTER — Ambulatory Visit: Admission: RE | Admit: 2014-01-06 | Payer: Medicare Other | Source: Ambulatory Visit

## 2014-01-06 DIAGNOSIS — R251 Tremor, unspecified: Secondary | ICD-10-CM

## 2014-01-06 DIAGNOSIS — R292 Abnormal reflex: Secondary | ICD-10-CM

## 2014-01-06 MED ORDER — GADOBENATE DIMEGLUMINE 529 MG/ML IV SOLN
17.0000 mL | Freq: Once | INTRAVENOUS | Status: AC | PRN
  Administered 2014-01-06: 17 mL via INTRAVENOUS

## 2014-01-09 ENCOUNTER — Encounter (HOSPITAL_COMMUNITY): Payer: Self-pay | Admitting: Emergency Medicine

## 2014-01-09 ENCOUNTER — Telehealth: Payer: Self-pay | Admitting: Neurology

## 2014-01-09 NOTE — Telephone Encounter (Signed)
Message copied by Silvio Pate on Mon Jan 09, 2014  8:01 AM ------      Message from: TAT, REBECCA S      Created: Fri Jan 06, 2014  4:19 PM       Lesly Rubenstein, let pt know that MRI stable and c-spine didn't show anything to account for sx's ------

## 2014-01-09 NOTE — Telephone Encounter (Signed)
Patient made aware of MR results.  

## 2014-01-10 ENCOUNTER — Ambulatory Visit: Payer: Medicare Other | Admitting: Neurology

## 2014-01-12 ENCOUNTER — Ambulatory Visit (INDEPENDENT_AMBULATORY_CARE_PROVIDER_SITE_OTHER): Payer: Medicare Other | Admitting: Neurology

## 2014-01-12 ENCOUNTER — Encounter: Payer: Self-pay | Admitting: Neurology

## 2014-01-12 VITALS — BP 130/68 | HR 80 | Ht 68.0 in | Wt 181.0 lb

## 2014-01-12 DIAGNOSIS — G255 Other chorea: Secondary | ICD-10-CM

## 2014-01-12 DIAGNOSIS — N179 Acute kidney failure, unspecified: Secondary | ICD-10-CM

## 2014-01-12 DIAGNOSIS — D61818 Other pancytopenia: Secondary | ICD-10-CM

## 2014-01-12 DIAGNOSIS — D649 Anemia, unspecified: Secondary | ICD-10-CM

## 2014-01-12 DIAGNOSIS — R251 Tremor, unspecified: Secondary | ICD-10-CM

## 2014-01-12 DIAGNOSIS — D72819 Decreased white blood cell count, unspecified: Secondary | ICD-10-CM

## 2014-01-12 DIAGNOSIS — R259 Unspecified abnormal involuntary movements: Secondary | ICD-10-CM

## 2014-01-12 NOTE — Patient Instructions (Signed)
1. We are referring you to [facility] for a DaT scan. They will contact you directly to set up a time for this testing. If you do not hear from them they can be contacted at [phone]. 2. Your provider has requested that you have labwork completed today. Please go to [facility] on the first floor of this building before leaving the office today.
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
## 2014-01-13 LAB — CBC WITH DIFFERENTIAL/PLATELET
Basophils Absolute: 0.1 10*3/uL (ref 0.0–0.1)
Basophils Relative: 1 % (ref 0–1)
EOS ABS: 0.1 10*3/uL (ref 0.0–0.7)
Eosinophils Relative: 2 % (ref 0–5)
HEMATOCRIT: 36.8 % (ref 36.0–46.0)
HEMOGLOBIN: 12.2 g/dL (ref 12.0–15.0)
LYMPHS ABS: 2 10*3/uL (ref 0.7–4.0)
Lymphocytes Relative: 31 % (ref 12–46)
MCH: 29.5 pg (ref 26.0–34.0)
MCHC: 33.2 g/dL (ref 30.0–36.0)
MCV: 89.1 fL (ref 78.0–100.0)
MONO ABS: 0.4 10*3/uL (ref 0.1–1.0)
MONOS PCT: 6 % (ref 3–12)
Neutro Abs: 3.8 10*3/uL (ref 1.7–7.7)
Neutrophils Relative %: 60 % (ref 43–77)
Platelets: 171 10*3/uL (ref 150–400)
RBC: 4.13 MIL/uL (ref 3.87–5.11)
RDW: 13.8 % (ref 11.5–15.5)
WBC: 6.3 10*3/uL (ref 4.0–10.5)

## 2014-01-13 LAB — RPR

## 2014-01-13 LAB — ANA: ANA: NEGATIVE

## 2014-01-13 LAB — VITAMIN B12: Vitamin B-12: 879 pg/mL (ref 211–911)

## 2014-01-13 LAB — PARATHYROID HORMONE, INTACT (NO CA): PTH: 51 pg/mL (ref 14–64)

## 2014-01-13 LAB — C-REACTIVE PROTEIN

## 2014-01-13 LAB — AMMONIA: Ammonia: 15 umol/L — ABNORMAL LOW (ref 16–53)

## 2014-01-13 LAB — SEDIMENTATION RATE: Sed Rate: 20 mm/hr (ref 0–22)

## 2014-01-13 LAB — FERRITIN: Ferritin: 63 ng/mL (ref 10–291)

## 2014-01-13 LAB — HIV ANTIBODY (ROUTINE TESTING W REFLEX): HIV 1&2 Ab, 4th Generation: NONREACTIVE

## 2014-01-13 LAB — TSH: TSH: 2.165 u[IU]/mL (ref 0.350–4.500)

## 2014-01-14 LAB — COPPER, SERUM: COPPER: 123 ug/dL (ref 70–175)

## 2014-01-14 LAB — LEAD, BLOOD: Lead-Whole Blood: <2 ug/dL (ref <10)

## 2014-01-16 LAB — CERULOPLASMIN: CERULOPLASMIN: 29 mg/dL (ref 18–53)

## 2014-01-16 LAB — GLIADIN ANTIBODIES, SERUM
GLIADIN IGA: 6.4 U/mL (ref <20)
GLIADIN IGG: 16.1 U/mL (ref <20)

## 2014-01-17 ENCOUNTER — Telehealth: Payer: Self-pay | Admitting: Neurology

## 2014-01-17 LAB — ANTIPHOSPHOLIPID SYNDROME EVAL, BLD
ANTICARDIOLIPIN IGA: 9 U/mL (ref <22)
Anticardiolipin IgG: 4 GPL U/mL (ref <23)
Anticardiolipin IgM: 2 MPL U/mL (ref <11)
DRVVT: 35.1 s (ref <42.9)
Lupus Anticoagulant: NOT DETECTED
PHOSPHATYDALSERINE, IGM: 9 U/mL (ref <22)
PTT Lupus Anticoagulant: 34.9 secs (ref 28.0–43.0)
Phosphatidylserine IgG Autoantibodies: 5 U/mL (ref <16)
Phosphatydalserine, IgA: 8 U/mL (ref <20)

## 2014-01-17 NOTE — Telephone Encounter (Signed)
Patient made aware labs are normal.

## 2014-01-17 NOTE — Telephone Encounter (Signed)
Message copied by Silvio Pate on Tue Jan 17, 2014 10:53 AM ------      Message from: TAT, REBECCA S      Created: Tue Jan 17, 2014 10:53 AM       Let pt know that labs look good ------

## 2014-01-17 NOTE — Telephone Encounter (Signed)
Tried to call patient with no answer. Will try again later 

## 2014-01-24 NOTE — ED Provider Notes (Signed)
Addendum to my note 10/14/13  68 y.o. Female nad Afebrile hr 76 bp 83/49 sats 96% WD female nad Mucous membranes dry cv- regular rate and rhythm, peripheral pulses intact Lungs- cta Skin and mucous membranes appear dry     Hilario Quarryanielle S Chloe Bluett, MD 01/24/14 1225

## 2014-02-08 ENCOUNTER — Telehealth: Payer: Self-pay | Admitting: Neurology

## 2014-02-08 NOTE — Telephone Encounter (Signed)
Let pt know that her DaT scan was normal which is great news!

## 2014-02-08 NOTE — Telephone Encounter (Signed)
No answer with no option to leave voicemail. Will call again later.

## 2014-02-10 NOTE — Telephone Encounter (Signed)
Patient made aware Dat scan normal. She will call back to schedule a follow up appt in about 4 months.

## 2015-03-29 ENCOUNTER — Encounter (INDEPENDENT_AMBULATORY_CARE_PROVIDER_SITE_OTHER): Payer: Medicare Other | Admitting: Ophthalmology

## 2015-03-29 DIAGNOSIS — H59031 Cystoid macular edema following cataract surgery, right eye: Secondary | ICD-10-CM | POA: Diagnosis not present

## 2015-03-29 DIAGNOSIS — H2512 Age-related nuclear cataract, left eye: Secondary | ICD-10-CM | POA: Diagnosis not present

## 2015-03-29 DIAGNOSIS — H35033 Hypertensive retinopathy, bilateral: Secondary | ICD-10-CM

## 2015-03-29 DIAGNOSIS — H43813 Vitreous degeneration, bilateral: Secondary | ICD-10-CM

## 2015-03-29 DIAGNOSIS — I10 Essential (primary) hypertension: Secondary | ICD-10-CM

## 2015-05-10 ENCOUNTER — Encounter (INDEPENDENT_AMBULATORY_CARE_PROVIDER_SITE_OTHER): Payer: Medicare Other | Admitting: Ophthalmology

## 2015-05-10 DIAGNOSIS — H35033 Hypertensive retinopathy, bilateral: Secondary | ICD-10-CM

## 2015-05-10 DIAGNOSIS — H43813 Vitreous degeneration, bilateral: Secondary | ICD-10-CM

## 2015-05-10 DIAGNOSIS — I10 Essential (primary) hypertension: Secondary | ICD-10-CM | POA: Diagnosis not present

## 2015-05-10 DIAGNOSIS — H59031 Cystoid macular edema following cataract surgery, right eye: Secondary | ICD-10-CM

## 2016-11-09 ENCOUNTER — Encounter (HOSPITAL_COMMUNITY): Payer: Self-pay | Admitting: *Deleted

## 2016-11-09 ENCOUNTER — Emergency Department (HOSPITAL_COMMUNITY): Payer: Medicare Other

## 2016-11-09 ENCOUNTER — Emergency Department (HOSPITAL_COMMUNITY)
Admission: EM | Admit: 2016-11-09 | Discharge: 2016-11-09 | Disposition: A | Discharge status: Home / Self Care | Payer: Medicare Other | Attending: Emergency Medicine | Admitting: Emergency Medicine

## 2016-11-09 DIAGNOSIS — Z79899 Other long term (current) drug therapy: Secondary | ICD-10-CM | POA: Diagnosis not present

## 2016-11-09 DIAGNOSIS — R5383 Other fatigue: Secondary | ICD-10-CM

## 2016-11-09 DIAGNOSIS — R0602 Shortness of breath: Secondary | ICD-10-CM | POA: Diagnosis not present

## 2016-11-09 DIAGNOSIS — N183 Chronic kidney disease, stage 3 (moderate): Secondary | ICD-10-CM | POA: Insufficient documentation

## 2016-11-09 DIAGNOSIS — I129 Hypertensive chronic kidney disease with stage 1 through stage 4 chronic kidney disease, or unspecified chronic kidney disease: Secondary | ICD-10-CM | POA: Insufficient documentation

## 2016-11-09 LAB — COMPREHENSIVE METABOLIC PANEL
ALBUMIN: 3.9 g/dL (ref 3.5–5.0)
ALK PHOS: 86 U/L (ref 38–126)
ALT: 31 U/L (ref 14–54)
ANION GAP: 7 (ref 5–15)
AST: 38 U/L (ref 15–41)
BUN: 19 mg/dL (ref 6–20)
CHLORIDE: 108 mmol/L (ref 101–111)
CO2: 25 mmol/L (ref 22–32)
Calcium: 9.2 mg/dL (ref 8.9–10.3)
Creatinine, Ser: 1.06 mg/dL — ABNORMAL HIGH (ref 0.44–1.00)
GFR calc non Af Amer: 51 mL/min — ABNORMAL LOW (ref >60)
GFR, EST AFRICAN AMERICAN: 59 mL/min — AB (ref >60)
GLUCOSE: 77 mg/dL (ref 65–99)
POTASSIUM: 4.1 mmol/L (ref 3.5–5.1)
SODIUM: 140 mmol/L (ref 135–145)
Total Bilirubin: 0.4 mg/dL (ref 0.3–1.2)
Total Protein: 7.1 g/dL (ref 6.5–8.1)

## 2016-11-09 LAB — CBC WITH DIFFERENTIAL/PLATELET
BASOS PCT: 1 %
Basophils Absolute: 0 10*3/uL (ref 0.0–0.1)
EOS PCT: 2 %
Eosinophils Absolute: 0.1 10*3/uL (ref 0.0–0.7)
HCT: 40.4 % (ref 36.0–46.0)
HEMOGLOBIN: 14.2 g/dL (ref 12.0–15.0)
Lymphocytes Relative: 27 %
Lymphs Abs: 2 10*3/uL (ref 0.7–4.0)
MCH: 32.6 pg (ref 26.0–34.0)
MCHC: 35.1 g/dL (ref 30.0–36.0)
MCV: 92.9 fL (ref 78.0–100.0)
MONOS PCT: 9 %
Monocytes Absolute: 0.7 10*3/uL (ref 0.1–1.0)
NEUTROS PCT: 61 %
Neutro Abs: 4.6 10*3/uL (ref 1.7–7.7)
PLATELETS: 129 10*3/uL — AB (ref 150–400)
RBC: 4.35 MIL/uL (ref 3.87–5.11)
RDW: 12.9 % (ref 11.5–15.5)
WBC: 7.4 10*3/uL (ref 4.0–10.5)

## 2016-11-09 LAB — BRAIN NATRIURETIC PEPTIDE: B Natriuretic Peptide: 58.5 pg/mL (ref 0.0–100.0)

## 2016-11-09 NOTE — ED Provider Notes (Signed)
[facility]-EMERGENCY DEPT Provider Note   CSN: [id number] Arrival date & time: 11/09/16  1725     History   Chief Complaint Chief Complaint  Patient presents with  . Fatigue    HPI Candace Robbins is a 71 y.o. female.  HPI  CC: fatigue  Onset/Duration: 2 weeks Timing: constant, unchanged Severity: mild to moderate Modifying Factors:  Improved by: sleep  Worsened by: nothing Associated Signs/Symptoms:  Pertinent (+): stable DOE w/o acute change; intermittent leg swelling. Also her niece noted blue lips today.  Pertinent (-): fevers, chills, n/v/d, abd pain, chest pain   Past Medical History:  Diagnosis Date  . Allergy   . Arthritis   . Cataract   . Chronic kidney disease   . GERD (gastroesophageal reflux disease)   . Neuromuscular disorder (HCC)    Pt doesn't know what this means  . Syncope     Patient Active Problem List   Diagnosis Date Noted  . Acute bronchitis 10/15/2013  . Near syncope 10/15/2013  . Dehydration 10/15/2013  . CKD (chronic kidney disease) stage 3, GFR 30-59 ml/min 10/15/2013  . Renal failure 10/14/2013  . ARF (acute renal failure) (HCC) 10/14/2013  . HYPERLIPIDEMIA 08/02/2009  . BENIGN PAROXYSMAL POSITIONAL VERTIGO 08/02/2009  . HYPERTENSION 08/02/2009  . SUBARACHNOID HEMORRHAGE 08/02/2009  . WOUND INFECTION 08/02/2009  . MEMORY LOSS 08/02/2009  . MRI, BRAIN, ABNORMAL 08/02/2009  . LACERATION, SCALP 08/02/2009  . HEAD TRAUMA, CLOSED 08/02/2009    Past Surgical History:  Procedure Laterality Date  . BREAST REDUCTION SURGERY    . SCALP LACERATION REPAIR      OB History    No data available       Home Medications    Prior to Admission medications   Medication Sig Start Date End Date Taking? Authorizing Provider  acetaminophen (TYLENOL) 650 MG CR tablet Take 650 mg by mouth every 8 (eight) hours as needed for pain.   Yes [provider]  Calcium Carb-Cholecalciferol (CALCIUM 600/VITAMIN D3) 600-800 MG-UNIT TABS Take 1  tablet by mouth daily.   Yes [provider]  Multiple Vitamins-Minerals (CENTRUM SILVER PO) Take 1 tablet by mouth at bedtime.    Yes [provider]  simvastatin (ZOCOR) 20 MG tablet Take 20 mg by mouth at bedtime.    Yes [provider]  Turmeric 500 MG CAPS Take 500 mg by mouth at bedtime.   Yes [provider]  valsartan (DIOVAN) 320 MG tablet Take 320 mg by mouth at bedtime.   Yes [provider]    Family History Family History  Problem Relation Age of Onset  . Cancer Mother     Social History Social History  Substance Use Topics  . Smoking status: Never Smoker  . Smokeless tobacco: Never Used  . Alcohol use No     Allergies   Patient has no known allergies.   Review of Systems Review of Systems All other systems are reviewed and are negative for acute change except as noted in the HPI   Physical Exam Updated Vital Signs BP (!) 147/72 (BP Location: Right Arm)   Pulse 68   Temp 98 F (36.7 C) (Oral)   Resp 18   SpO2 99%   Physical Exam  Constitutional: She is oriented to person, place, and time. She appears well-developed and well-nourished. No distress.  HENT:  Head: Normocephalic and atraumatic.  Nose: Nose normal.  Eyes: Pupils are equal, round, and reactive to light. Conjunctivae and EOM are normal.  Right eye exhibits no discharge. Left eye exhibits no discharge. No scleral icterus.  Neck: Normal range of motion. Neck supple.  Cardiovascular: Normal rate and regular rhythm.  Exam reveals no gallop and no friction rub.   No murmur heard. Pulmonary/Chest: Effort normal and breath sounds normal. No stridor. No respiratory distress. She has no rales.  Abdominal: Soft. She exhibits no distension. There is no tenderness.  Musculoskeletal: She exhibits no edema or tenderness.  Neurological: She is alert and oriented to person, place, and time.  Skin: Skin is warm and dry. No rash noted. She is not diaphoretic. No  erythema.  Psychiatric: She has a normal mood and affect.  Vitals reviewed.    ED Treatments / Results  Labs (all labs ordered are listed, but only abnormal results are displayed) Labs Reviewed  CBC WITH DIFFERENTIAL/PLATELET - Abnormal; Notable for the following:       Result Value   Platelets 129 (*)    All other components within normal limits  COMPREHENSIVE METABOLIC PANEL - Abnormal; Notable for the following:    Creatinine, Ser 1.06 (*)    GFR calc non Af Amer 51 (*)    GFR calc Af Amer 59 (*)    All other components within normal limits  BRAIN NATRIURETIC PEPTIDE    EKG  EKG Interpretation  Date/Time:  Sunday November 09 2016 18:30:34 EDT Ventricular Rate:  63 PR Interval:    QRS Duration: 81 QT Interval:  414 QTC Calculation: 424 R Axis:   0 Text Interpretation:  Sinus rhythm Low voltage, precordial leads Nonspecific T wave abnormality Otherwise no significant change Confirmed by Drema Pry [phone]) on 11/09/2016 7:40:07 PM       Radiology Dg Chest 2 View  Result Date: 11/09/2016 CLINICAL DATA:  Shortness of breath and weakness today, history GERD EXAM: CHEST  2 VIEW COMPARISON:  10/14/2013 FINDINGS: Upper normal size of cardiac silhouette. Mediastinal contours and pulmonary vascularity normal. Lungs clear. No pleural effusion or pneumothorax. Scattered endplate spurs thoracic spine. IMPRESSION: No acute abnormalities. Electronically Signed   By: Ulyses Southward M.D.   On: 11/09/2016 18:54    Procedures Procedures (including critical care time)  Medications Ordered in ED Medications - No data to display   Initial Impression / Assessment and Plan / ED Course  I have reviewed the triage vital signs and the nursing notes.  Pertinent labs & imaging results that were available during my care of the patient were reviewed by me and considered in my medical decision making (see chart for details).     No infectious process identified during history, exam. Labs  closely reassuring. Chest x-ray without evidence of pneumonia, pulmonary edema, pleural effusions, cardiomegaly. No evidence to suggest fluid overload on exam. BNP within normal limits. Low suspicion for heart failure.  The etiology of her fatigue undetermined at this time however does not appear to be emergent/serious process. That she is safe for discharge with strict return precautions.  Patient instructed to follow-up with her primary care provider for further workup and management.  Final Clinical Impressions(s) / ED Diagnoses   Final diagnoses:  SOB (shortness of breath)  Other fatigue   Disposition: Discharge  Condition: Good  I have discussed the results, Dx and Tx plan with the patient who expressed understanding and agree(s) with the plan. Discharge instructions discussed at great length. The patient was given strict return precautions who verbalized understanding of the instructions. No further questions at time of discharge.    Discharge  Medication List as of 11/09/2016  8:58 PM      Follow Up: Johny BlamerHarris, William, MD [address] [place] [address] [phone]  Schedule an appointment as soon as possible for a visit  If symptoms do not improve or  worsen       Sharbel Sahagun, Amadeo GarnetPedro Eduardo, MD 11/09/16 2347

## 2016-11-09 NOTE — ED Triage Notes (Signed)
Pt is c/o increasing feeling of being tired. Reason she came in she states is because she "noticed her lips were turning blue, had a faint radial pulse and her niece was unable to get a blood pressure reading at home." She is denying chest pain, shortness of breath or any other cardiopulmonary symptoms.

## 2019-02-22 ENCOUNTER — Other Ambulatory Visit: Payer: Self-pay | Admitting: Family Medicine

## 2019-02-22 DIAGNOSIS — R1011 Right upper quadrant pain: Secondary | ICD-10-CM

## 2019-03-02 ENCOUNTER — Other Ambulatory Visit: Payer: Medicare Other

## 2019-03-08 ENCOUNTER — Ambulatory Visit
Admission: RE | Admit: 2019-03-08 | Discharge: 2019-03-08 | Disposition: A | Discharge status: Home / Self Care | Payer: Medicare Other | Source: Ambulatory Visit | Attending: Family Medicine | Admitting: Family Medicine

## 2019-03-08 DIAGNOSIS — R1011 Right upper quadrant pain: Secondary | ICD-10-CM

## 2019-06-19 ENCOUNTER — Ambulatory Visit: Payer: Medicare Other | Attending: Internal Medicine

## 2019-06-19 DIAGNOSIS — Z23 Encounter for immunization: Secondary | ICD-10-CM | POA: Insufficient documentation

## 2019-06-19 NOTE — Progress Notes (Signed)
   Covid-19 Vaccination Clinic  Name:  Candace Robbins    MRN: [id number] DOB: 03/04/1946  06/19/2019  [name] was observed post Covid-19 immunization for 15 minutes without incidence. She was provided with Vaccine Information Sheet and instruction to access the V-Safe system.   [name] was instructed to call 911 with any severe reactions post vaccine: Marland Kitchen Difficulty breathing  . Swelling of your face and throat  . A fast heartbeat  . A bad rash all over your body  . Dizziness and weakness    Immunizations Administered    Name Date Dose VIS Date Route   Pfizer COVID-19 Vaccine 06/19/2019  2:14 PM 0.3 mL 04/08/2019 Intramuscular   Manufacturer: [employer], [employer]   Lot: [id number]   NDC: [address]-2

## 2019-07-13 ENCOUNTER — Ambulatory Visit: Payer: Medicare Other | Attending: Internal Medicine

## 2019-07-13 DIAGNOSIS — Z23 Encounter for immunization: Secondary | ICD-10-CM

## 2019-07-13 NOTE — Progress Notes (Signed)
   Covid-19 Vaccination Clinic  Name:  Candace Robbins    MRN: [id number] DOB: 1945-08-03  07/13/2019  [name] was observed post Covid-19 immunization for 15 minutes without incident. She was provided with Vaccine Information Sheet and instruction to access the V-Safe system.   [name] was instructed to call 911 with any severe reactions post vaccine: Marland Kitchen Difficulty breathing  . Swelling of face and throat  . A fast heartbeat  . A bad rash all over body  . Dizziness and weakness   Immunizations Administered    Name Date Dose VIS Date Route   Pfizer COVID-19 Vaccine 07/13/2019  9:30 AM 0.3 mL 04/08/2019 Intramuscular   Manufacturer: [employer], [employer]   Lot: [id number]   NDC: [address]-4

## 2020-05-22 DIAGNOSIS — E7800 Pure hypercholesterolemia, unspecified: Secondary | ICD-10-CM | POA: Diagnosis not present

## 2020-05-22 DIAGNOSIS — R251 Tremor, unspecified: Secondary | ICD-10-CM | POA: Diagnosis not present

## 2020-05-22 DIAGNOSIS — M542 Cervicalgia: Secondary | ICD-10-CM | POA: Diagnosis not present

## 2020-05-22 DIAGNOSIS — N183 Chronic kidney disease, stage 3 unspecified: Secondary | ICD-10-CM | POA: Diagnosis not present

## 2020-05-22 DIAGNOSIS — R7303 Prediabetes: Secondary | ICD-10-CM | POA: Diagnosis not present

## 2020-05-22 DIAGNOSIS — J0100 Acute maxillary sinusitis, unspecified: Secondary | ICD-10-CM | POA: Diagnosis not present

## 2020-05-22 DIAGNOSIS — M109 Gout, unspecified: Secondary | ICD-10-CM | POA: Diagnosis not present

## 2020-05-22 DIAGNOSIS — I10 Essential (primary) hypertension: Secondary | ICD-10-CM | POA: Diagnosis not present

## 2020-06-26 IMAGING — US US ABDOMEN COMPLETE
1 series · 14 of 25 positions shown · non-contrast
Comparison: Renal ultrasound 10/15/2013.

CLINICAL DATA: Right upper quadrant pain.

EXAM:
ABDOMEN ULTRASOUND COMPLETE

[Series 1: us abdomen complete · 0.19mm/px · 14 of 79 slices shown]
[im 1/79]
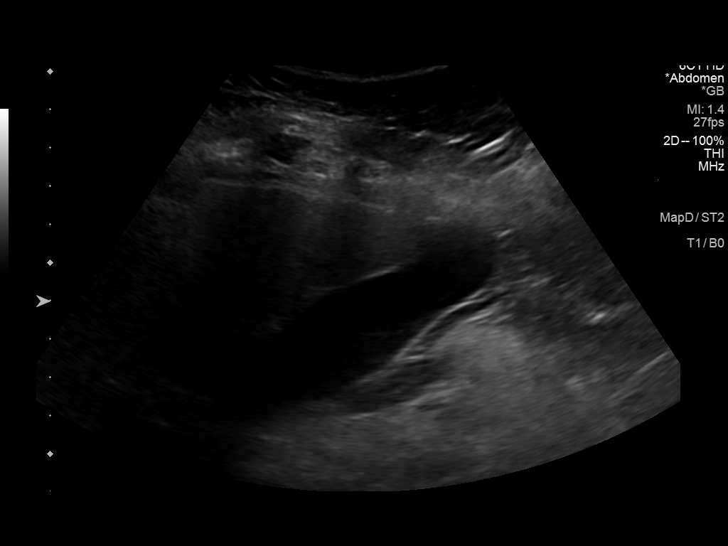
[im 7/79]
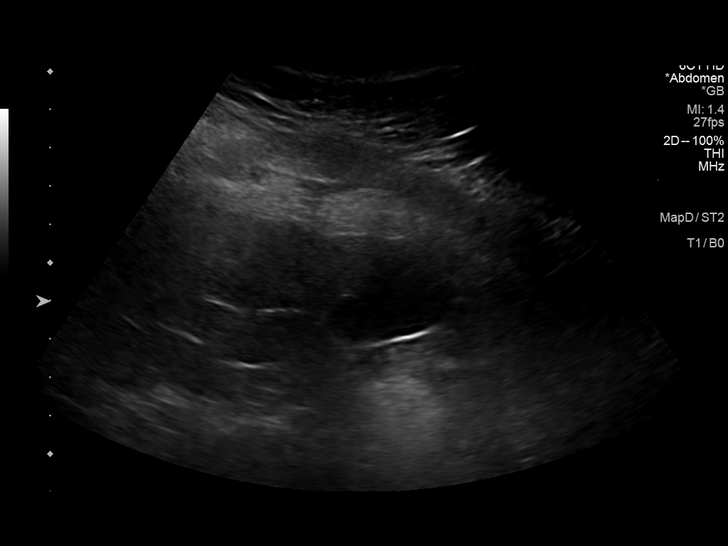
[im 14/79]
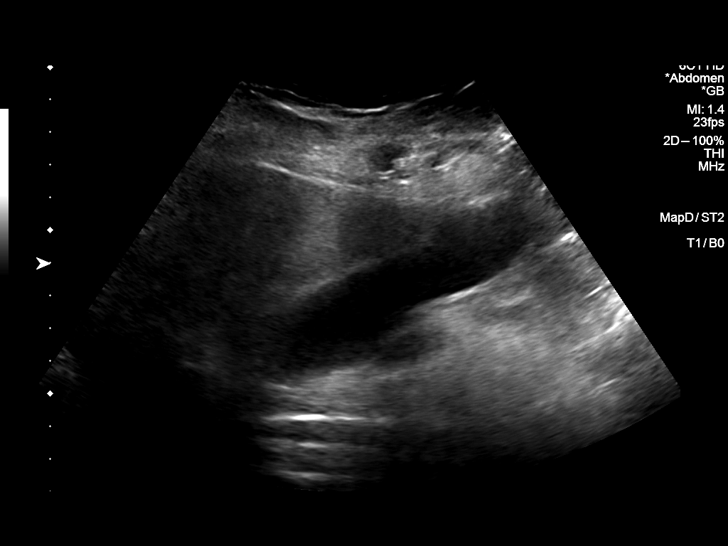
[im 20/79]
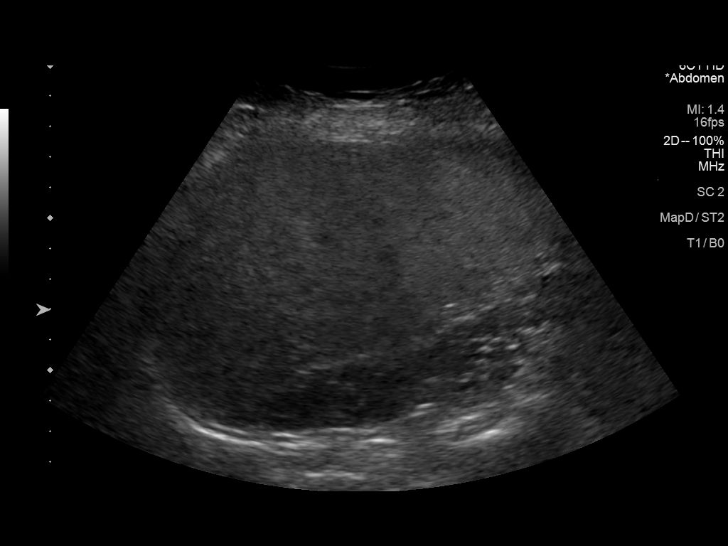
[im 27/79]
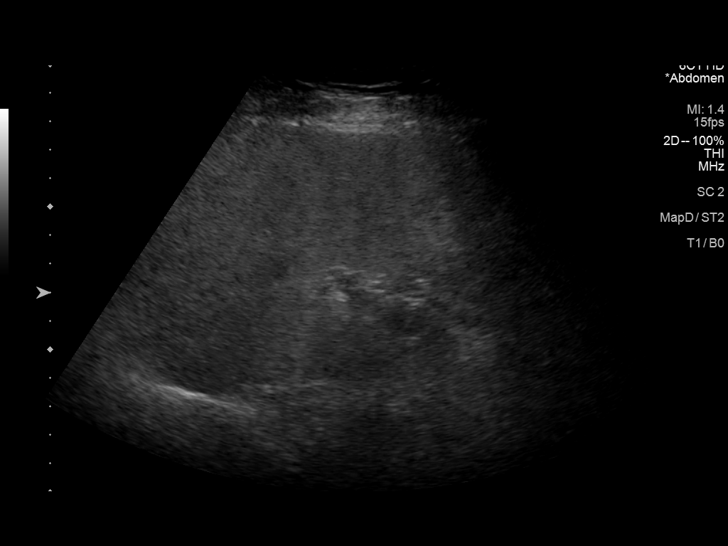
[im 30/79]
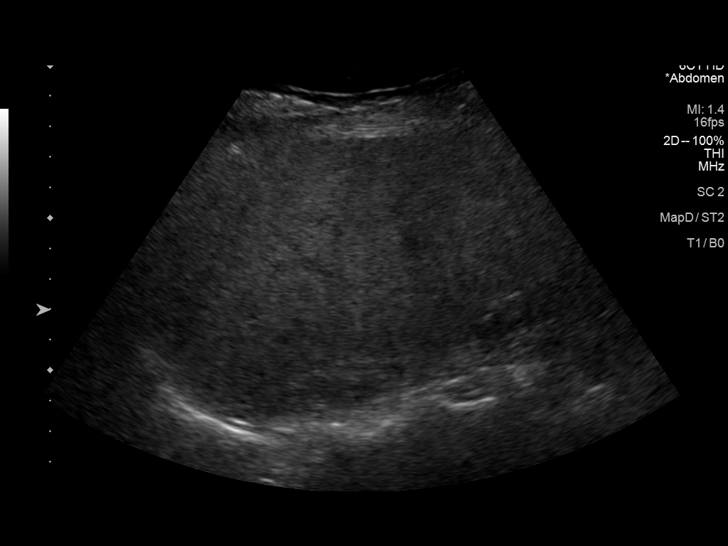
[im 36/79]
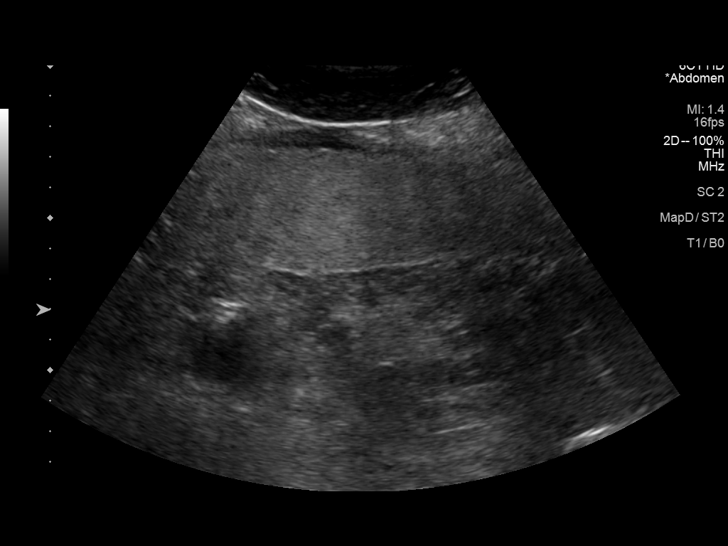
[im 43/79]
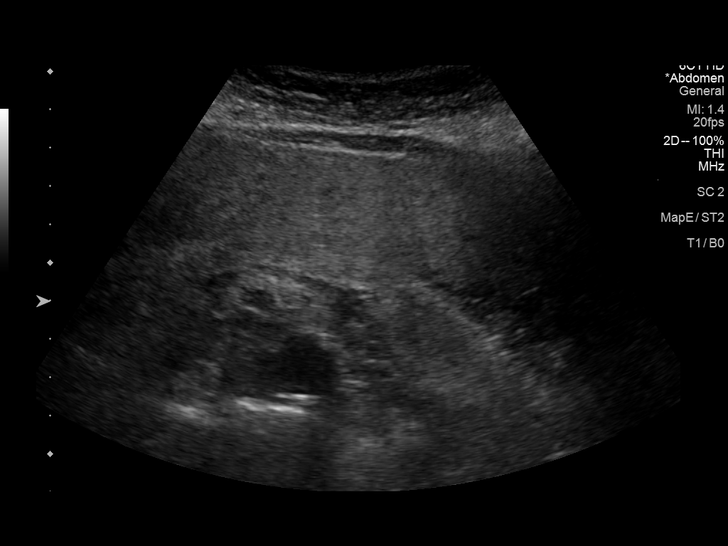
[im 49/79]
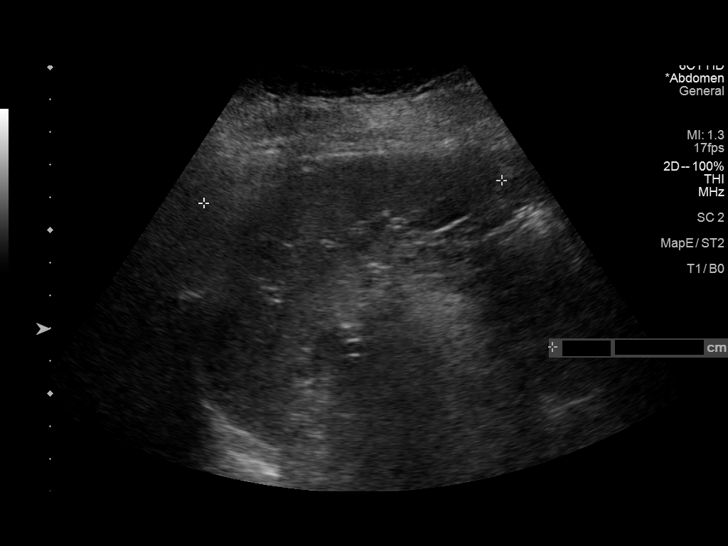
[im 53/79]
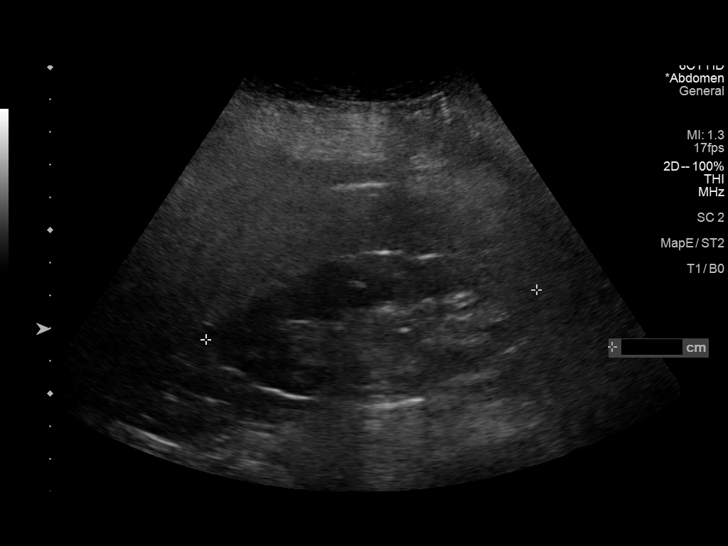
[im 59/79]
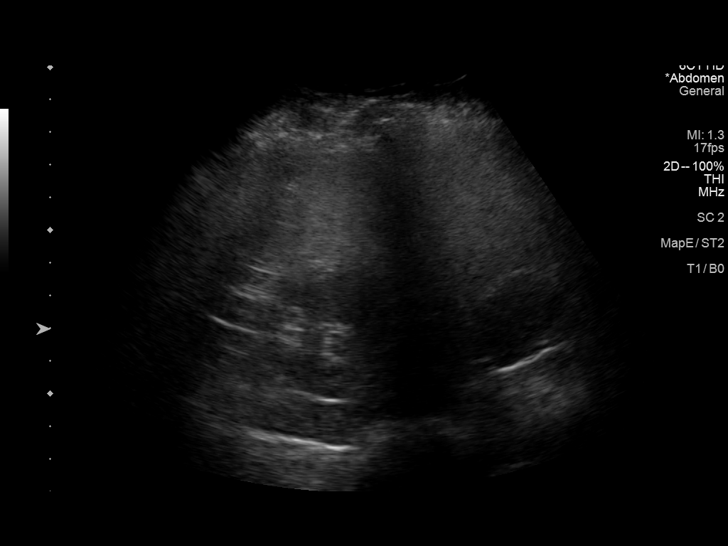
[im 66/79]
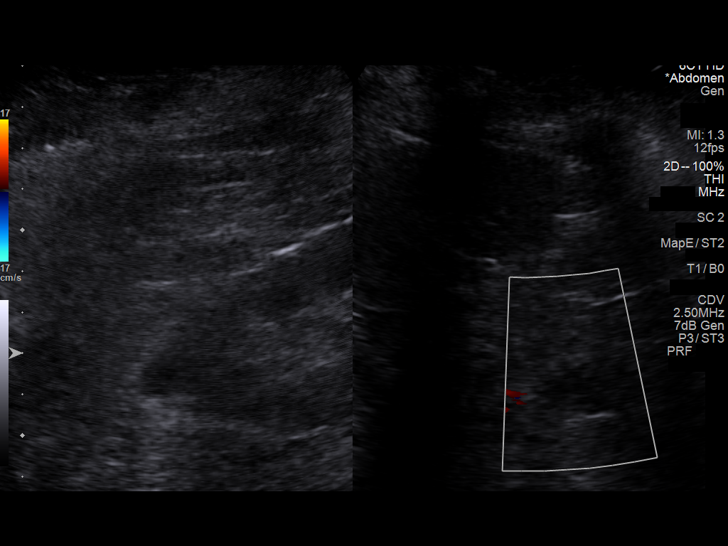
[im 72/79]
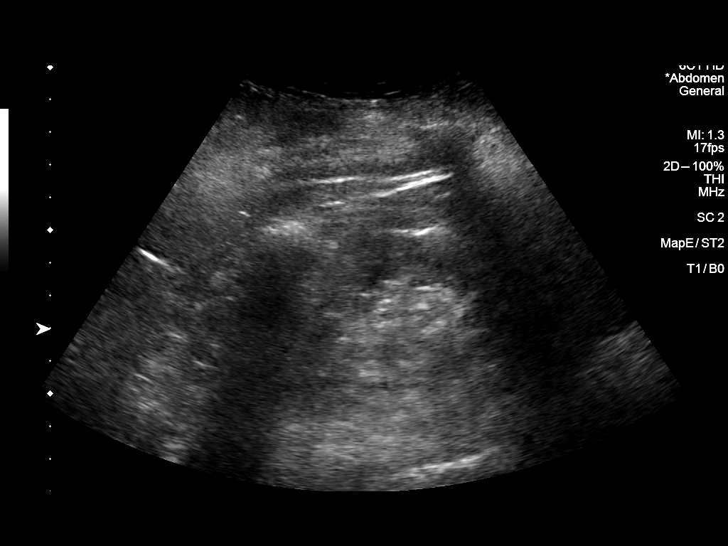
[im 79/79]
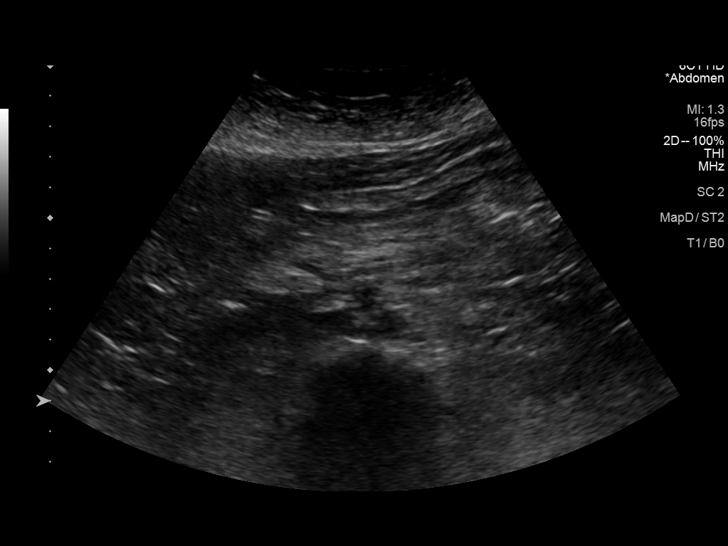

[14 of 25 positions shown; findings below may reference images not displayed]

FINDINGS: Gallbladder: No gallstones or wall thickening visualized. No
sonographic Murphy sign noted by sonographer.

Common bile duct: Diameter: 3.6 mm

Liver: Liver is inhomogeneous consistent fatty infiltration or
hepatocellular disease. Portal vein is patent on color Doppler
imaging with normal direction of blood flow towards the liver.

IVC: No abnormality visualized.

Pancreas: Visualized portion unremarkable.

Spleen: Size and appearance within normal limits.

Right Kidney: Length: 10.2 cm. Echogenicity within normal limits. No
mass or hydronephrosis visualized.

Left Kidney: Length: 10.3 cm. Echogenicity within normal limits.
cm simple cyst. No hydronephrosis visualized.

Abdominal aorta: No aneurysm visualized.

Other findings: None.
IMPRESSION: 1. Liver is heterogeneous consistent fatty infiltration or
hepatocellular disease.

2.  No gallstones or biliary distention.

## 2021-06-14 DIAGNOSIS — R1013 Epigastric pain: Secondary | ICD-10-CM | POA: Diagnosis not present

## 2021-06-14 DIAGNOSIS — N183 Chronic kidney disease, stage 3 unspecified: Secondary | ICD-10-CM | POA: Diagnosis not present

## 2021-06-14 DIAGNOSIS — I10 Essential (primary) hypertension: Secondary | ICD-10-CM | POA: Diagnosis not present

## 2021-06-14 DIAGNOSIS — E7800 Pure hypercholesterolemia, unspecified: Secondary | ICD-10-CM | POA: Diagnosis not present

## 2021-12-02 DIAGNOSIS — R300 Dysuria: Secondary | ICD-10-CM | POA: Diagnosis not present

## 2021-12-02 DIAGNOSIS — R42 Dizziness and giddiness: Secondary | ICD-10-CM | POA: Diagnosis not present

## 2021-12-12 DIAGNOSIS — N390 Urinary tract infection, site not specified: Secondary | ICD-10-CM | POA: Diagnosis not present

## 2021-12-12 DIAGNOSIS — M542 Cervicalgia: Secondary | ICD-10-CM | POA: Diagnosis not present

## 2021-12-12 DIAGNOSIS — R300 Dysuria: Secondary | ICD-10-CM | POA: Diagnosis not present

## 2021-12-12 DIAGNOSIS — K219 Gastro-esophageal reflux disease without esophagitis: Secondary | ICD-10-CM | POA: Diagnosis not present

## 2021-12-12 DIAGNOSIS — I10 Essential (primary) hypertension: Secondary | ICD-10-CM | POA: Diagnosis not present

## 2021-12-12 DIAGNOSIS — D509 Iron deficiency anemia, unspecified: Secondary | ICD-10-CM | POA: Diagnosis not present

## 2021-12-12 DIAGNOSIS — E7800 Pure hypercholesterolemia, unspecified: Secondary | ICD-10-CM | POA: Diagnosis not present

## 2021-12-12 DIAGNOSIS — Z79899 Other long term (current) drug therapy: Secondary | ICD-10-CM | POA: Diagnosis not present

## 2021-12-12 DIAGNOSIS — N183 Chronic kidney disease, stage 3 unspecified: Secondary | ICD-10-CM | POA: Diagnosis not present

## 2021-12-12 DIAGNOSIS — R251 Tremor, unspecified: Secondary | ICD-10-CM | POA: Diagnosis not present

## 2021-12-23 ENCOUNTER — Other Ambulatory Visit: Payer: Self-pay | Admitting: Family Medicine

## 2021-12-23 ENCOUNTER — Ambulatory Visit
Admission: RE | Admit: 2021-12-23 | Discharge: 2021-12-23 | Disposition: A | Discharge status: Home / Self Care | Payer: Medicare Other | Source: Ambulatory Visit | Attending: Family Medicine | Admitting: Family Medicine

## 2021-12-23 DIAGNOSIS — M542 Cervicalgia: Secondary | ICD-10-CM

## 2021-12-23 DIAGNOSIS — R739 Hyperglycemia, unspecified: Secondary | ICD-10-CM | POA: Diagnosis not present

## 2023-02-02 DIAGNOSIS — I10 Essential (primary) hypertension: Secondary | ICD-10-CM | POA: Diagnosis not present

## 2023-02-02 DIAGNOSIS — N183 Chronic kidney disease, stage 3 unspecified: Secondary | ICD-10-CM | POA: Diagnosis not present

## 2023-02-02 DIAGNOSIS — M542 Cervicalgia: Secondary | ICD-10-CM | POA: Diagnosis not present

## 2023-02-02 DIAGNOSIS — R600 Localized edema: Secondary | ICD-10-CM | POA: Diagnosis not present

## 2023-02-02 DIAGNOSIS — K219 Gastro-esophageal reflux disease without esophagitis: Secondary | ICD-10-CM | POA: Diagnosis not present

## 2023-02-02 DIAGNOSIS — Z0000 Encounter for general adult medical examination without abnormal findings: Secondary | ICD-10-CM | POA: Diagnosis not present

## 2023-02-02 DIAGNOSIS — D509 Iron deficiency anemia, unspecified: Secondary | ICD-10-CM | POA: Diagnosis not present

## 2023-02-02 DIAGNOSIS — E7800 Pure hypercholesterolemia, unspecified: Secondary | ICD-10-CM | POA: Diagnosis not present

## 2023-02-02 DIAGNOSIS — Z23 Encounter for immunization: Secondary | ICD-10-CM | POA: Diagnosis not present

## 2023-02-02 DIAGNOSIS — L989 Disorder of the skin and subcutaneous tissue, unspecified: Secondary | ICD-10-CM | POA: Diagnosis not present

## 2023-02-02 DIAGNOSIS — R251 Tremor, unspecified: Secondary | ICD-10-CM | POA: Diagnosis not present

## 2023-04-17 DIAGNOSIS — Z1211 Encounter for screening for malignant neoplasm of colon: Secondary | ICD-10-CM | POA: Diagnosis not present

## 2023-04-17 DIAGNOSIS — Z800 Family history of malignant neoplasm of digestive organs: Secondary | ICD-10-CM | POA: Diagnosis not present

## 2023-04-17 DIAGNOSIS — R1310 Dysphagia, unspecified: Secondary | ICD-10-CM | POA: Diagnosis not present

## 2023-07-17 DIAGNOSIS — K648 Other hemorrhoids: Secondary | ICD-10-CM | POA: Diagnosis not present

## 2023-07-17 DIAGNOSIS — Z800 Family history of malignant neoplasm of digestive organs: Secondary | ICD-10-CM | POA: Diagnosis not present

## 2023-07-17 DIAGNOSIS — D120 Benign neoplasm of cecum: Secondary | ICD-10-CM | POA: Diagnosis not present

## 2023-07-17 DIAGNOSIS — Z1211 Encounter for screening for malignant neoplasm of colon: Secondary | ICD-10-CM | POA: Diagnosis not present

## 2023-07-17 DIAGNOSIS — D124 Benign neoplasm of descending colon: Secondary | ICD-10-CM | POA: Diagnosis not present

## 2023-07-21 DIAGNOSIS — D124 Benign neoplasm of descending colon: Secondary | ICD-10-CM | POA: Diagnosis not present

## 2023-07-21 DIAGNOSIS — D120 Benign neoplasm of cecum: Secondary | ICD-10-CM | POA: Diagnosis not present

## 2024-02-11 DIAGNOSIS — E7800 Pure hypercholesterolemia, unspecified: Secondary | ICD-10-CM | POA: Diagnosis not present

## 2024-02-11 DIAGNOSIS — I10 Essential (primary) hypertension: Secondary | ICD-10-CM | POA: Diagnosis not present

## 2024-02-11 DIAGNOSIS — R739 Hyperglycemia, unspecified: Secondary | ICD-10-CM | POA: Diagnosis not present

## 2024-02-11 DIAGNOSIS — Z0000 Encounter for general adult medical examination without abnormal findings: Secondary | ICD-10-CM | POA: Diagnosis not present

## 2024-02-11 DIAGNOSIS — L989 Disorder of the skin and subcutaneous tissue, unspecified: Secondary | ICD-10-CM | POA: Diagnosis not present

## 2024-02-11 DIAGNOSIS — M542 Cervicalgia: Secondary | ICD-10-CM | POA: Diagnosis not present

## 2024-02-11 DIAGNOSIS — Z23 Encounter for immunization: Secondary | ICD-10-CM | POA: Diagnosis not present
